# Patient Record
Sex: Female | Born: 1995 | Race: Black or African American | Hispanic: No | Marital: Single | Smoking: Former smoker
Health system: Southern US, Community
[De-identification: ages and names within clinical notes are randomized; demographics above are authoritative.]

## PROBLEM LIST (undated history)

## (undated) DIAGNOSIS — O165 Unspecified maternal hypertension, complicating the puerperium: Secondary | ICD-10-CM

## (undated) DIAGNOSIS — S060X9A Concussion with loss of consciousness of unspecified duration, initial encounter: Secondary | ICD-10-CM

## (undated) DIAGNOSIS — A6000 Herpesviral infection of urogenital system, unspecified: Secondary | ICD-10-CM

---
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
## 2014-02-24 ENCOUNTER — Emergency Department: Payer: Self-pay | Admitting: Internal Medicine

## 2014-06-28 DIAGNOSIS — S060XAA Concussion with loss of consciousness status unknown, initial encounter: Secondary | ICD-10-CM

## 2014-06-28 DIAGNOSIS — S060X9A Concussion with loss of consciousness of unspecified duration, initial encounter: Secondary | ICD-10-CM

## 2014-06-28 HISTORY — DX: Concussion with loss of consciousness of unspecified duration, initial encounter: S06.0X9A

## 2014-06-28 HISTORY — DX: Concussion with loss of consciousness status unknown, initial encounter: S06.0XAA

## 2015-06-15 ENCOUNTER — Encounter: Payer: Self-pay | Admitting: Emergency Medicine

## 2015-06-15 ENCOUNTER — Emergency Department
Admission: EM | Admit: 2015-06-15 | Discharge: 2015-06-15 | Disposition: A | Discharge status: Home / Self Care | Payer: Managed Care, Other (non HMO) | Attending: Emergency Medicine | Admitting: Emergency Medicine

## 2015-06-15 DIAGNOSIS — S0083XA Contusion of other part of head, initial encounter: Secondary | ICD-10-CM | POA: Insufficient documentation

## 2015-06-15 DIAGNOSIS — S0993XA Unspecified injury of face, initial encounter: Secondary | ICD-10-CM | POA: Diagnosis present

## 2015-06-15 DIAGNOSIS — Z87891 Personal history of nicotine dependence: Secondary | ICD-10-CM | POA: Insufficient documentation

## 2015-06-15 DIAGNOSIS — Y998 Other external cause status: Secondary | ICD-10-CM | POA: Diagnosis not present

## 2015-06-15 DIAGNOSIS — Y92410 Unspecified street and highway as the place of occurrence of the external cause: Secondary | ICD-10-CM | POA: Diagnosis not present

## 2015-06-15 DIAGNOSIS — Y9389 Activity, other specified: Secondary | ICD-10-CM | POA: Diagnosis not present

## 2015-06-15 DIAGNOSIS — S0990XA Unspecified injury of head, initial encounter: Secondary | ICD-10-CM | POA: Diagnosis not present

## 2015-06-15 DIAGNOSIS — Z3202 Encounter for pregnancy test, result negative: Secondary | ICD-10-CM | POA: Diagnosis not present

## 2015-06-15 LAB — POCT PREGNANCY, URINE: Preg Test, Ur: NEGATIVE

## 2015-06-15 MED ORDER — IBUPROFEN 800 MG PO TABS: 800.0000 mg | ORAL_TABLET | Freq: Three times a day (TID) | ORAL | Status: DC

## 2015-06-15 MED ORDER — ACETAMINOPHEN 500 MG PO TABS
1000.0000 mg | ORAL_TABLET | Freq: Once | ORAL | Status: AC
  Administered 2015-06-15: 1000 mg via ORAL
  Filled 2015-06-15: qty 2
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
## 2015-06-15 NOTE — Discharge Instructions (Signed)
Ice to face if needed for swelling or pain. Ibuprofen with food as needed for inflammation and pain. [facility] if any continued problems.

## 2015-09-22 ENCOUNTER — Emergency Department
Admission: EM | Admit: 2015-09-22 | Discharge: 2015-09-22 | Disposition: A | Discharge status: Home / Self Care | Payer: Managed Care, Other (non HMO) | Attending: Emergency Medicine | Admitting: Emergency Medicine

## 2015-09-22 ENCOUNTER — Encounter: Payer: Self-pay | Admitting: Emergency Medicine

## 2015-09-22 ENCOUNTER — Emergency Department
Admission: EM | Admit: 2015-09-22 | Discharge: 2015-09-22 | Disposition: A | Discharge status: Home / Self Care | Payer: Managed Care, Other (non HMO)

## 2015-09-22 DIAGNOSIS — Z791 Long term (current) use of non-steroidal anti-inflammatories (NSAID): Secondary | ICD-10-CM | POA: Insufficient documentation

## 2015-09-22 DIAGNOSIS — A749 Chlamydial infection, unspecified: Secondary | ICD-10-CM | POA: Insufficient documentation

## 2015-09-22 DIAGNOSIS — F17200 Nicotine dependence, unspecified, uncomplicated: Secondary | ICD-10-CM | POA: Diagnosis not present

## 2015-09-22 DIAGNOSIS — R102 Pelvic and perineal pain: Secondary | ICD-10-CM | POA: Diagnosis present

## 2015-09-22 LAB — COMPREHENSIVE METABOLIC PANEL
ALT: 13 U/L — ABNORMAL LOW (ref 14–54)
AST: 18 U/L (ref 15–41)
Albumin: 4.4 g/dL (ref 3.5–5.0)
Alkaline Phosphatase: 41 U/L (ref 38–126)
Anion gap: 5 (ref 5–15)
BUN: 7 mg/dL (ref 6–20)
CO2: 25 mmol/L (ref 22–32)
Calcium: 9.2 mg/dL (ref 8.9–10.3)
Chloride: 105 mmol/L (ref 101–111)
Creatinine, Ser: 0.68 mg/dL (ref 0.44–1.00)
GFR calc Af Amer: >60 mL/min (ref >60)
GFR calc non Af Amer: >60 mL/min (ref >60)
Glucose, Bld: 82 mg/dL (ref 65–99)
Potassium: 3.4 mmol/L — ABNORMAL LOW (ref 3.5–5.1)
Sodium: 135 mmol/L (ref 135–145)
Total Bilirubin: 0.5 mg/dL (ref 0.3–1.2)
Total Protein: 7.2 g/dL (ref 6.5–8.1)

## 2015-09-22 LAB — URINALYSIS COMPLETE WITH MICROSCOPIC (ARMC ONLY)
Bacteria, UA: NONE SEEN
Bilirubin Urine: NEGATIVE
Glucose, UA: NEGATIVE mg/dL
Ketones, ur: NEGATIVE mg/dL
Nitrite: NEGATIVE
Protein, ur: NEGATIVE mg/dL
Specific Gravity, Urine: 1.015 (ref 1.005–1.030)
pH: 9 — ABNORMAL HIGH (ref 5.0–8.0)

## 2015-09-22 LAB — CBC
HCT: 37.4 % (ref 35.0–47.0)
Hemoglobin: 13.1 g/dL (ref 12.0–16.0)
MCH: 33.8 pg (ref 26.0–34.0)
MCHC: 35.1 g/dL (ref 32.0–36.0)
MCV: 96.2 fL (ref 80.0–100.0)
Platelets: 215 10*3/uL (ref 150–440)
RBC: 3.88 MIL/uL (ref 3.80–5.20)
RDW: 12.5 % (ref 11.5–14.5)
WBC: 6.4 10*3/uL (ref 3.6–11.0)

## 2015-09-22 LAB — CHLAMYDIA/NGC RT PCR (ARMC ONLY)
Chlamydia Tr: DETECTED — AB
N gonorrhoeae: NOT DETECTED

## 2015-09-22 LAB — WET PREP, GENITAL
Clue Cells Wet Prep HPF POC: NONE SEEN
Sperm: NONE SEEN
Trich, Wet Prep: NONE SEEN
Yeast Wet Prep HPF POC: NONE SEEN

## 2015-09-22 LAB — PREGNANCY, URINE: Preg Test, Ur: NEGATIVE

## 2015-09-22 LAB — LIPASE, BLOOD: Lipase: 17 U/L (ref 11–51)

## 2015-09-22 MED ORDER — AZITHROMYCIN 250 MG PO TABS
1000.0000 mg | ORAL_TABLET | Freq: Once | ORAL | Status: AC
  Administered 2015-09-22: 1000 mg via ORAL
  Filled 2015-09-22: qty 4

## 2015-09-22 NOTE — Discharge Instructions (Signed)
Chlamydia, Female Chlamydia is an infection. It is spread through sexual contact. Chlamydia can be in different areas of the body. These areas include the cervix, urethra, throat, or rectum. You may not know you have chlamydia because many people never develop the symptoms. Chlamydia is not difficult to treat once you know you have it. However, if it is left untreated, chlamydia can lead to more serious health problems.  CAUSES  Chlamydia is caused by bacteria. It is a sexually transmitted disease. It is passed from an infected partner during intimate contact. This contact could be with the genitals, mouth, or rectal area. Chlamydia can also be passed from mothers to babies during birth. SIGNS AND SYMPTOMS  There may not be any symptoms. This is often the case early in the infection. If symptoms develop, they may include:  Mild pain and discomfort when urinating.  Redness, soreness, and swelling (inflammation) of the rectum.  Vaginal discharge.  Painful intercourse.  Abdominal pain.  Bleeding between menstrual periods. DIAGNOSIS  To diagnose this infection, your health care provider will do a pelvic exam. Cultures will be taken of the vagina, cervix, urine, and possibly the rectum to verify the diagnosis.  TREATMENT You will be given antibiotic medicines. If you are pregnant, certain types of antibiotics will need to be avoided. Any sexual partners should also be treated, even if they do not show symptoms. Your health care provider may test you for infection again 3 months after treatment. HOME CARE INSTRUCTIONS   Take your antibiotic medicine as directed by your health care provider. Finish the antibiotic even if you start to feel better.  Take medicines only as directed by your health care provider.  Inform any sexual partners about the infection. They should also be treated.  Do not have sexual contact until your health care provider tells you it is okay.  Get plenty of  rest.  Eat a well-balanced diet.  Drink enough fluids to keep your urine clear or pale yellow.  Keep all follow-up visits as directed by your health care provider. SEEK MEDICAL CARE IF:  You have painful urination.  You have abdominal pain.  You have vaginal discharge.  You have painful sexual intercourse.  You have bleeding between periods and after sex.  You have a fever. SEEK IMMEDIATE MEDICAL CARE IF:   You experience nausea or vomiting.  You experience excessive sweating (diaphoresis).  You have difficulty swallowing.   This information is not intended to replace advice given to you by your health care provider. Make sure you discuss any questions you have with your health care provider.   Document Released: 03/24/2005 Document Revised: 03/05/2015 Document Reviewed: 02/19/2013 Elsevier Interactive Patient Education 2016 [employer]  
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
## 2015-09-22 NOTE — ED Provider Notes (Signed)
[facility] Emergency Department Provider Note  ____________________________________________    I have reviewed the triage vital signs and the nursing notes.   HISTORY  Chief Complaint Abdominal Pain    HPI Carla Hill is a 20 y.o. female who presents with lower left pelvic cramping. She reports this is been occurring for 1 week. She denies vaginal discharge. She does report a protected intercourse. She denies fevers or chills. No dysuria. No history of abdominal surgeries. Complains of mild to moderate pelvic cramping primarily on the left. No history of ovarian cysts.     History reviewed. No pertinent past medical history.  There are no active problems to display for this patient.   History reviewed. No pertinent past surgical history.  Current Outpatient Rx  Name  Route  Sig  Dispense  Refill  . ibuprofen (ADVIL,MOTRIN) 800 MG tablet   Oral   Take 1 tablet (800 mg total) by mouth 3 (three) times daily.   30 tablet   0     Allergies Review of patient's allergies indicates no known allergies.  History reviewed. No pertinent family history.  Social History Social History  Substance Use Topics  . Smoking status: Current Every Day Smoker  . Smokeless tobacco: None  . Alcohol Use: Yes    Review of Systems  Constitutional: Negative for fever. Eyes: Negative for discharge ENT: Negative for sore throat Cardiovascular: Negative for palpitations Respiratory: Negative for cough Gastrointestinal: Negative for abdominal pain Genitourinary: Negative for dysuria. As above Musculoskeletal: Negative for back pain. Skin: Negative for rash. Neurological: Negative for focal weakness Psychiatric: no anxiety    ____________________________________________   PHYSICAL EXAM:  VITAL SIGNS: ED Triage Vitals  Enc Vitals Group     BP 09/22/15 1229 89/66 mmHg     Pulse Rate 09/22/15 1229 74     Resp --      Temp 09/22/15 1229 98.3 F (36.8  C)     Temp Source 09/22/15 1229 Oral     SpO2 09/22/15 1229 100 %     Weight 09/22/15 1229 130 lb (58.968 kg)     Height 09/22/15 1229  (1.676 m)     Head Cir --      Peak Flow --      Pain Score 09/22/15 1159 7     Pain Loc --      Pain Edu? --      Excl. in GC? --      Constitutional: Alert and oriented. Well appearing and in no distress.  Eyes: Conjunctivae are normal. No erythema or injection ENT   Head: Normocephalic and atraumatic.   Mouth/Throat: Mucous membranes are moist. Cardiovascular: Normal rate, regular rhythm.  Respiratory: Normal respiratory effort without tachypnea nor retractions.  Gastrointestinal: Soft and non-tender in all quadrants. No distention. There is no CVA tenderness. Genitourinary: Thick white discharge with mild cervicitis, no CMT Musculoskeletal: Nontender with normal range of motion in all extremities.  Neurologic:  Normal speech and language. No gross focal neurologic deficits are appreciated. Skin:  Skin is warm, dry and intact. No rash noted. Psychiatric: Mood and affect are normal. Patient exhibits appropriate insight and judgment.  ____________________________________________    LABS (pertinent positives/negatives)  Labs Reviewed  COMPREHENSIVE METABOLIC PANEL - Abnormal; Notable for the following:    Potassium 3.4 (*)    ALT 13 (*)    All other components within normal limits  URINALYSIS COMPLETEWITH MICROSCOPIC ([facility] ONLY) - Abnormal; Notable for the following:  Color, Urine YELLOW (*)    APPearance HAZY (*)    Hgb urine dipstick 2+ (*)    pH 9.0 (*)    Leukocytes, UA TRACE (*)    Squamous Epithelial / LPF 6-30 (*)    All other components within normal limits  WET PREP, GENITAL  CHLAMYDIA/NGC RT PCR ([facility] ONLY)  LIPASE, BLOOD  CBC  PREGNANCY, URINE    ____________________________________________   EKG  None  ____________________________________________     RADIOLOGY  None  ____________________________________________   PROCEDURES  Procedure(s) performed: none  Critical Care performed: none  ____________________________________________   INITIAL IMPRESSION / ASSESSMENT AND PLAN / ED COURSE  Pertinent labs & imaging results that were available during my care of the patient were reviewed by me and considered in my medical decision making (see chart for details).  Patient presents with vague lower pelvic cramping primarily in the left. She does have thick discharge with evidence of mild cervicitis. Wet prep and GC chlamydia sent. Otherwise labs are reassuring and exam is overall benign.  Chlamydia test positive, patient treated with azithromycin recommended no sexual activity for 7 days, treat all partners  ____________________________________________   FINAL CLINICAL IMPRESSION(S) / ED DIAGNOSES  Final diagnoses:  Chlamydia          Jene Everyobert Kaci Dillie, MD 09/22/15 1726

## 2015-10-21 LAB — HM PAP SMEAR: HM Pap smear: NEGATIVE

## 2016-06-01 ENCOUNTER — Encounter: Payer: Self-pay | Admitting: Emergency Medicine

## 2016-06-01 ENCOUNTER — Emergency Department
Admission: EM | Admit: 2016-06-01 | Discharge: 2016-06-01 | Disposition: A | Discharge status: Home / Self Care | Payer: Managed Care, Other (non HMO) | Attending: Emergency Medicine | Admitting: Emergency Medicine

## 2016-06-01 DIAGNOSIS — Z791 Long term (current) use of non-steroidal anti-inflammatories (NSAID): Secondary | ICD-10-CM | POA: Insufficient documentation

## 2016-06-01 DIAGNOSIS — J40 Bronchitis, not specified as acute or chronic: Secondary | ICD-10-CM | POA: Diagnosis not present

## 2016-06-01 DIAGNOSIS — F17200 Nicotine dependence, unspecified, uncomplicated: Secondary | ICD-10-CM | POA: Insufficient documentation

## 2016-06-01 DIAGNOSIS — R05 Cough: Secondary | ICD-10-CM | POA: Diagnosis present

## 2016-06-01 MED ORDER — PREDNISONE 50 MG PO TABS: 50.0000 mg | ORAL_TABLET | Freq: Every day | ORAL | 0 refills | Status: DC

## 2016-06-01 MED ORDER — ALBUTEROL SULFATE HFA 108 (90 BASE) MCG/ACT IN AERS: 2.0000 | INHALATION_SPRAY | RESPIRATORY_TRACT | 0 refills | Status: DC | PRN

## 2016-06-01 MED ORDER — PSEUDOEPH-BROMPHEN-DM 30-2-10 MG/5ML PO SYRP: 10.0000 mL | ORAL_SOLUTION | Freq: Four times a day (QID) | ORAL | 0 refills | Status: DC | PRN

## 2016-06-01 NOTE — ED Triage Notes (Signed)
Pt presents with cough since last Friday.

## 2016-06-01 NOTE — ED Provider Notes (Signed)
[facility] Emergency Department Provider Note  ____________________________________________  Time seen: Approximately 6:21 PM  I have reviewed the triage vital signs and the nursing notes.   HISTORY  Chief Complaint Cough    HPI Carla Hill is a 20 y.o. female who presents to emergency department complaining of 5 day history of coughing. Patient states that symptoms began insidiously. She denies any headache, visual changes,significant nasal congestion, sore throat, difficulty breathing, abdominal pain, nausea or vomiting. Patient states that the cough is dry. She denies any wheezing. She is not tried any medications prior to arrival. No other complaints at this time.   History reviewed. No pertinent past medical history.  There are no active problems to display for this patient.   History reviewed. No pertinent surgical history.  Prior to Admission medications   Medication Sig Start Date End Date Taking? Authorizing Provider  albuterol (PROVENTIL HFA;VENTOLIN HFA) 108 (90 Base) MCG/ACT inhaler Inhale 2 puffs into the lungs every 4 (four) hours as needed for wheezing or shortness of breath. 06/01/16   Delorise RoyalsJonathan D Mattelyn Imhoff, PA-C  brompheniramine-pseudoephedrine-DM 30-2-10 MG/5ML syrup Take 10 mLs by mouth 4 (four) times daily as needed. 06/01/16   Delorise RoyalsJonathan D Lamyah Creed, PA-C  ibuprofen (ADVIL,MOTRIN) 800 MG tablet Take 1 tablet (800 mg total) by mouth 3 (three) times daily. 06/15/15   Tommi Rumpshonda L Summers, PA-C  predniSONE (DELTASONE) 50 MG tablet Take 1 tablet (50 mg total) by mouth daily with breakfast. 06/01/16   Delorise RoyalsJonathan D Caramia Boutin, PA-C    Allergies Patient has no known allergies.  No family history on file.  Social History Social History  Substance Use Topics  . Smoking status: Current Every Day Smoker  . Smokeless tobacco: Never Used  . Alcohol use Yes     Review of Systems  Constitutional: No fever/chills Eyes: No visual changes. No  discharge ENT: No upper respiratory complaints. Cardiovascular: no chest pain. Respiratory: Positive for nonproductive cough. No SOB. Gastrointestinal: No abdominal pain.  No nausea, no vomiting. Musculoskeletal: Negative for musculoskeletal pain. Skin: Negative for rash, abrasions, lacerations, ecchymosis. Neurological: Negative for headaches, focal weakness or numbness. 10-point ROS otherwise negative.  ____________________________________________   PHYSICAL EXAM:  VITAL SIGNS: ED Triage Vitals  Enc Vitals Group     BP 06/01/16 1746 136/86     Pulse Rate 06/01/16 1746 100     Resp 06/01/16 1746 20     Temp 06/01/16 1746 98 F (36.7 C)     Temp Source 06/01/16 1746 Oral     SpO2 06/01/16 1746 98 %     Weight 06/01/16 1747 130 lb (59 kg)     Height --      Head Circumference --      Peak Flow --      Pain Score 06/01/16 1747 8     Pain Loc --      Pain Edu? --      Excl. in GC? --      Constitutional: Alert and oriented. Well appearing and in no acute distress. Eyes: Conjunctivae are normal. PERRL. EOMI. Head: Atraumatic. ENT:      Ears: EACs and TMs are unremarkable bilaterally      Nose: No congestion/rhinnorhea.      Mouth/Throat: Mucous membranes are moist.  Neck: No stridor.   Hematological/Lymphatic/Immunilogical: No cervical lymphadenopathy. Cardiovascular: Normal rate, regular rhythm. Normal S1 and S2.  Good peripheral circulation. Respiratory: Normal respiratory effort without tachypnea or retractions. Lungs with scattered coarse breath sounds and mild  expiratory wheezing. No rales or rhonchi.Peri [place]. Good air entry to the bases with no decreased or absent breath sounds. Musculoskeletal: Full range of motion to all extremities. No gross deformities appreciated. Neurologic:  Normal speech and language. No gross focal neurologic deficits are appreciated.  Skin:  Skin is warm, dry and intact. No rash noted. Psychiatric: Mood and affect are normal. Speech and behavior  are normal. Patient exhibits appropriate insight and judgement.   ____________________________________________   LABS (all labs ordered are listed, but only abnormal results are displayed)  Labs Reviewed - No data to display ____________________________________________  EKG   ____________________________________________  RADIOLOGY   No results found.  ____________________________________________    PROCEDURES  Procedure(s) performed:    Procedures    Medications - No data to display   ____________________________________________   INITIAL IMPRESSION / ASSESSMENT AND PLAN / ED COURSE  Pertinent labs & imaging results that were available during my care of the patient were reviewed by me and considered in my medical decision making (see chart for details).  Review of the [place] CSRS was performed in accordance of the [facility] prior to dispensing any controlled drugs.  Clinical Course     Patient's diagnosis is consistent with Bronchitis. Patient's exam is reassuring. She did have a few scattered coarse breath sounds/wheezing. At this time, no indication for imaging or labs.. Patient will be discharged home with prescriptions for prednisone, cough medication, albuterol inhaler. Patient is to follow up with primary care as needed or otherwise directed. Patient is given ED precautions to return to the ED for any worsening or new symptoms.     ____________________________________________  FINAL CLINICAL IMPRESSION(S) / ED DIAGNOSES  Final diagnoses:  Bronchitis      NEW MEDICATIONS STARTED DURING THIS VISIT:  New Prescriptions   ALBUTEROL (PROVENTIL HFA;VENTOLIN HFA) 108 (90 BASE) MCG/ACT INHALER    Inhale 2 puffs into the lungs every 4 (four) hours as needed for wheezing or shortness of breath.   BROMPHENIRAMINE-PSEUDOEPHEDRINE-DM 30-2-10 MG/5ML SYRUP    Take 10 mLs by mouth 4 (four) times daily as needed.   PREDNISONE (DELTASONE) 50 MG TABLET    Take 1 tablet  (50 mg total) by mouth daily with breakfast.        This chart was dictated using voice recognition software/Dragon. Despite best efforts to proofread, errors can occur which can change the meaning. Any change was purely unintentional.     Racheal PatchesJonathan D Cici Rodriges, PA-C 06/01/16 1835    Governor Rooksebecca Lord, MD 06/03/16 [id number]

## 2016-06-03 ENCOUNTER — Emergency Department: Payer: Managed Care, Other (non HMO)

## 2016-06-03 ENCOUNTER — Encounter: Payer: Self-pay | Admitting: Emergency Medicine

## 2016-06-03 ENCOUNTER — Emergency Department
Admission: EM | Admit: 2016-06-03 | Discharge: 2016-06-03 | Disposition: A | Discharge status: Home / Self Care | Payer: Managed Care, Other (non HMO) | Attending: Emergency Medicine | Admitting: Emergency Medicine

## 2016-06-03 DIAGNOSIS — F17200 Nicotine dependence, unspecified, uncomplicated: Secondary | ICD-10-CM | POA: Diagnosis not present

## 2016-06-03 DIAGNOSIS — R05 Cough: Secondary | ICD-10-CM | POA: Diagnosis present

## 2016-06-03 DIAGNOSIS — J40 Bronchitis, not specified as acute or chronic: Secondary | ICD-10-CM

## 2016-06-03 DIAGNOSIS — Z791 Long term (current) use of non-steroidal anti-inflammatories (NSAID): Secondary | ICD-10-CM | POA: Insufficient documentation

## 2016-06-03 DIAGNOSIS — Z79899 Other long term (current) drug therapy: Secondary | ICD-10-CM | POA: Diagnosis not present

## 2016-06-03 NOTE — ED Provider Notes (Signed)
[facility] Emergency Department Provider Note  ____________________________________________  Time seen: Approximately 10:14 PM  I have reviewed the triage vital signs and the nursing notes.   HISTORY  Chief Complaint Nasal Congestion and Cough    HPI Carla Hill is a 20 y.o. female who presents emergency department for continued complaint of coughing. Patient was seen by myself 2 days prior and diagnosed with bronchitis. Patient returns to the emergency department because "I'm not better yet." Patient is also requesting further work notes so she does not have to return to work. No headache, vision changes, neck pain, chest pain, multiple debridement, abdominal pain, nausea or vomiting. Patient has been taking prescribed medications.   History reviewed. No pertinent past medical history.  There are no active problems to display for this patient.   History reviewed. No pertinent surgical history.  Prior to Admission medications   Medication Sig Start Date End Date Taking? Authorizing Provider  albuterol (PROVENTIL HFA;VENTOLIN HFA) 108 (90 Base) MCG/ACT inhaler Inhale 2 puffs into the lungs every 4 (four) hours as needed for wheezing or shortness of breath. 06/01/16   Delorise RoyalsJonathan D Cuthriell, PA-C  brompheniramine-pseudoephedrine-DM 30-2-10 MG/5ML syrup Take 10 mLs by mouth 4 (four) times daily as needed. 06/01/16   Delorise RoyalsJonathan D Cuthriell, PA-C  ibuprofen (ADVIL,MOTRIN) 800 MG tablet Take 1 tablet (800 mg total) by mouth 3 (three) times daily. 06/15/15   Tommi Rumpshonda L Summers, PA-C  predniSONE (DELTASONE) 50 MG tablet Take 1 tablet (50 mg total) by mouth daily with breakfast. 06/01/16   Delorise RoyalsJonathan D Cuthriell, PA-C    Allergies Patient has no known allergies.  History reviewed. No pertinent family history.  Social History Social History  Substance Use Topics  . Smoking status: Current Every Day Smoker  . Smokeless tobacco: Never Used  . Alcohol use Yes      Review of Systems  Constitutional: No fever/chills Eyes: No visual changes. No discharge ENT: No upper respiratory complaints. Cardiovascular: no chest pain. Respiratory: Positive nonproductive cough. No SOB. Gastrointestinal: No abdominal pain.  No nausea, no vomiting.  Musculoskeletal: Negative for musculoskeletal pain. Skin: Negative for rash, abrasions, lacerations, ecchymosis. Neurological: Negative for headaches, focal weakness or numbness. 10-point ROS otherwise negative.  ____________________________________________   PHYSICAL EXAM:  VITAL SIGNS: ED Triage Vitals  Enc Vitals Group     BP 06/03/16 2040 114/73     Pulse Rate 06/03/16 2040 82     Resp 06/03/16 2040 18     Temp 06/03/16 2040 98.1 F (36.7 C)     Temp Source 06/03/16 2040 Oral     SpO2 06/03/16 2040 96 %     Weight 06/03/16 2041 130 lb (59 kg)     Height 06/03/16 2041 5\' 6"  (1.676 m)     Head Circumference --      Peak Flow --      Pain Score --      Pain Loc --      Pain Edu? --      Excl. in GC? --      Constitutional: Alert and oriented. Well appearing and in no acute distress. Eyes: Conjunctivae are normal. PERRL. EOMI. Head: Atraumatic. ENT:      Ears: EACs and TMs are unremarkable bilaterally      Nose: No congestion/rhinnorhea.      Mouth/Throat: Mucous membranes are moist.  Neck: No stridor. Neck is supple for range of motion Hematological/Lymphatic/Immunilogical: No cervical lymphadenopathy. Cardiovascular: Normal rate, regular rhythm. Normal S1 and S2.  Good peripheral circulation. Respiratory: Normal respiratory effort without tachypnea or retractions. Lungs CTAB. Good air entry to the bases with no decreased or absent breath sounds. Musculoskeletal: Full range of motion to all extremities. No gross deformities appreciated. Neurologic:  Normal speech and language. No gross focal neurologic deficits are appreciated.  Skin:  Skin is warm, dry and intact. No rash  noted. Psychiatric: Mood and affect are normal. Speech and behavior are normal. Patient exhibits appropriate insight and judgement.   ____________________________________________   LABS (all labs ordered are listed, but only abnormal results are displayed)  Labs Reviewed - No data to display ____________________________________________  EKG   ____________________________________________  RADIOLOGY Festus BarrenI, Jonathan D Cuthriell, personally viewed and evaluated these images (plain radiographs) as part of my medical decision making, as well as reviewing the written report by the radiologist.  Dg Chest 2 View  Result Date: 06/03/2016 CLINICAL DATA:  Shortness of breath and continued coughing, diagnosed with bronchitis 2 days prior. EXAM: CHEST  2 VIEW COMPARISON:  None. FINDINGS: The cardiomediastinal contours are normal. Mild hyperinflation. Pulmonary vasculature is normal. No consolidation, pleural effusion, or pneumothorax. No acute osseous abnormalities are seen. IMPRESSION: Mild hyperinflation, can be seen with bronchitis or asthma. No focal abnormality. Electronically Signed   By: Rubye OaksMelanie  Ehinger M.D.   On: 06/03/2016 21:50    ____________________________________________    PROCEDURES  Procedure(s) performed:    Procedures    Medications - No data to display   ____________________________________________   INITIAL IMPRESSION / ASSESSMENT AND PLAN / ED COURSE  Pertinent labs & imaging results that were available during my care of the patient were reviewed by me and considered in my medical decision making (see chart for details).  Review of the [place] CSRS was performed in accordance of the [facility] prior to dispensing any controlled drugs.  Clinical Course     Patient's diagnosis is consistent with Bronchitis. Patient returns to emergency department requesting further work note. At this time, exam is improved from previous visit. Chest x-ray reveals no areas consolidation  concerning for pneumonia. At this time, patient is informed that today's smack's time off granted by the emergency department if she requests for the time she may follow up with primary care..  Patient is given ED precautions to return to the ED for any worsening or new symptoms.     ____________________________________________  FINAL CLINICAL IMPRESSION(S) / ED DIAGNOSES  Final diagnoses:  Bronchitis      NEW MEDICATIONS STARTED DURING THIS VISIT:  Discharge Medication List as of 06/03/2016 10:15 PM          This chart was dictated using voice recognition software/Dragon. Despite best efforts to proofread, errors can occur which can change the meaning. Any change was purely unintentional.    Racheal PatchesJonathan D Cuthriell, PA-C 06/03/16 2321    Myrna Blazeravid Matthew Schaevitz, MD 06/04/16 [phone]

## 2016-06-03 NOTE — ED Triage Notes (Signed)
Pt ambulatory to triage with steady gait, no distress noted. Pt reports she was seen in ED on Tuesday, diagnosed with bronchitis, pt sts she is due to return to work tomorrow but feel she isn't able to do so, pt to ED today for a continued work note for tomorrow 06/04/16.

## 2016-06-28 NOTE — L&D Delivery Note (Signed)
Delivery Note Primary OB: [facility] Delivery Physician: Annamarie MajorPaul Shandelle Borrelli, MD Gestational Age: Full term Antepartum complications: none Intrapartum complications: Preeclampsia  A viable Female was delivered via vertex perentation.  Apgars:8 ,9  Weight:  pending .   Placenta status: spontaneous and Intact.  Cord: 3+ vessels;  with the following complications: none.  Anesthesia:  epidural Episiotomy:  none Lacerations:  periuretheral Suture Repair: 2.0 vicryl Est. Blood Loss (mL):  500 mL  Mom to postpartum.  Baby to Couplet care / Skin to Skin.  Annamarie MajorPaul Lavanda Nevels, MD, Merlinda FrederickFACOG [facility], [facility] 04/13/2017  11:55 PM [phone]

## 2016-07-09 ENCOUNTER — Emergency Department
Admission: EM | Admit: 2016-07-09 | Discharge: 2016-07-09 | Disposition: A | Discharge status: Home / Self Care | Payer: Managed Care, Other (non HMO) | Attending: Emergency Medicine | Admitting: Emergency Medicine

## 2016-07-09 ENCOUNTER — Encounter: Payer: Self-pay | Admitting: Emergency Medicine

## 2016-07-09 DIAGNOSIS — Z79899 Other long term (current) drug therapy: Secondary | ICD-10-CM | POA: Insufficient documentation

## 2016-07-09 DIAGNOSIS — F17200 Nicotine dependence, unspecified, uncomplicated: Secondary | ICD-10-CM | POA: Insufficient documentation

## 2016-07-09 DIAGNOSIS — N925 Other specified irregular menstruation: Secondary | ICD-10-CM | POA: Insufficient documentation

## 2016-07-09 DIAGNOSIS — Z791 Long term (current) use of non-steroidal anti-inflammatories (NSAID): Secondary | ICD-10-CM | POA: Insufficient documentation

## 2016-07-09 DIAGNOSIS — N926 Irregular menstruation, unspecified: Secondary | ICD-10-CM

## 2016-07-09 LAB — URINALYSIS, COMPLETE (UACMP) WITH MICROSCOPIC
Bacteria, UA: NONE SEEN
Bilirubin Urine: NEGATIVE
Glucose, UA: NEGATIVE mg/dL
Ketones, ur: NEGATIVE mg/dL
Leukocytes, UA: NEGATIVE
Nitrite: NEGATIVE
Protein, ur: NEGATIVE mg/dL
Specific Gravity, Urine: 1.01 (ref 1.005–1.030)
pH: 7 (ref 5.0–8.0)

## 2016-07-09 LAB — POCT PREGNANCY, URINE: Preg Test, Ur: NEGATIVE

## 2016-07-09 NOTE — ED Triage Notes (Signed)
Pt reports last menstrual period 06/02/17. Pt states she has regular periods and is late. Pt reports lower abdominal cramping like menstrual cramps. Pt denies fever, NVD. Pt reports white vaginal discharge, denies itching.

## 2016-07-09 NOTE — ED Provider Notes (Signed)
[facility] Emergency Department Provider Note  ____________________________________________  Time seen: Approximately 4:20 PM  I have reviewed the triage vital signs and the nursing notes.   HISTORY  Chief Complaint Possible pregnancy    HPI Carla Hill is a 21 y.o. female that presents to the emergency departmentwith a late menstrual period of 6 days. Patient states that she had some mild cramping a couple of days ago. Patient states that she had some white discharge, which is thin and not unusual for her. Patient has no concern for STD. No fever, chills, nausea, vomiting, back pain, dysuria, urgency, frequency.     History reviewed. No pertinent past medical history.  There are no active problems to display for this patient.   No past surgical history on file.  Prior to Admission medications   Medication Sig Start Date End Date Taking? Authorizing Provider  albuterol (PROVENTIL HFA;VENTOLIN HFA) 108 (90 Base) MCG/ACT inhaler Inhale 2 puffs into the lungs every 4 (four) hours as needed for wheezing or shortness of breath. 06/01/16   Delorise Royals Cuthriell, PA-C  brompheniramine-pseudoephedrine-DM 30-2-10 MG/5ML syrup Take 10 mLs by mouth 4 (four) times daily as needed. 06/01/16   Delorise Royals Cuthriell, PA-C  ibuprofen (ADVIL,MOTRIN) 800 MG tablet Take 1 tablet (800 mg total) by mouth 3 (three) times daily. 06/15/15   Tommi Rumps, PA-C  predniSONE (DELTASONE) 50 MG tablet Take 1 tablet (50 mg total) by mouth daily with breakfast. 06/01/16   Delorise Royals Cuthriell, PA-C    Allergies Patient has no known allergies.  No family history on file.  Social History Social History  Substance Use Topics  . Smoking status: Current Every Day Smoker  . Smokeless tobacco: Never Used  . Alcohol use Yes     Review of Systems  Constitutional: No fever/chills ENT: No upper respiratory complaints. Cardiovascular: No chest pain. Respiratory: No  SOB. Gastrointestinal: No nausea, no vomiting.  Genitourinary: Negative for dysuria. Musculoskeletal: Negative for musculoskeletal pain. Skin: Negative for rash, abrasions, lacerations, ecchymosis. Neurological: Negative for headaches, numbness or tingling   ____________________________________________   PHYSICAL EXAM:  VITAL SIGNS: ED Triage Vitals  Enc Vitals Group     BP 07/09/16 1521 126/75     Pulse Rate 07/09/16 1521 84     Resp 07/09/16 1521 18     Temp 07/09/16 1521 98.4 F (36.9 C)     Temp Source 07/09/16 1521 Oral     SpO2 07/09/16 1521 99 %     Weight 07/09/16 1521 130 lb (59 kg)     Height 07/09/16 1521 5\' 6"  (1.676 m)     Head Circumference --      Peak Flow --      Pain Score 07/09/16 1526 6     Pain Loc --      Pain Edu? --      Excl. in GC? --      Constitutional: Alert and oriented. Well appearing and in no acute distress. Eyes: Conjunctivae are normal. PERRL. EOMI. Head: Atraumatic. ENT:      Ears:      Nose: No congestion/rhinnorhea.      Mouth/Throat: Mucous membranes are moist.  Neck: No stridor.  Cardiovascular: Normal rate, regular rhythm. Normal S1 and S2.  Good peripheral circulation. Respiratory: Normal respiratory effort without tachypnea or retractions. Lungs CTAB. Good air entry to the bases with no decreased or absent breath sounds. Gastrointestinal: Bowel sounds 4 quadrants. Soft and nontender to palpation. No guarding or  rigidity. No palpable masses. No distention. No CVA tenderness. Musculoskeletal: Full range of motion to all extremities. No gross deformities appreciated. Neurologic:  Normal speech and language. No gross focal neurologic deficits are appreciated.  Skin:  Skin is warm, dry and intact. No rash noted. Psychiatric: Mood and affect are normal. Speech and behavior are normal. Patient exhibits appropriate insight and judgement.   ____________________________________________   LABS (all labs ordered are listed, but  only abnormal results are displayed)  Labs Reviewed  URINALYSIS, COMPLETE (UACMP) WITH MICROSCOPIC  POCT PREGNANCY, URINE   ____________________________________________  EKG   ____________________________________________  RADIOLOGY   No results found.  ____________________________________________    PROCEDURES  Procedure(s) performed:    Procedures    Medications - No data to display   ____________________________________________   INITIAL IMPRESSION / ASSESSMENT AND PLAN / ED COURSE  Pertinent labs & imaging results that were available during my care of the patient were reviewed by me and considered in my medical decision making (see chart for details).  Review of the [place] CSRS was performed in accordance of the [facility] prior to dispensing any controlled drugs.  Clinical Course     Patient's diagnosis is consistent with late menstrual period. Exam and vital signs are reassuring. Urine pregnancy test negative. No signs of infection on urinalysis. Patient is to follow up with ob/gyn as directed. Patient is given ED precautions to return to the ED for any worsening or new symptoms.   ____________________________________________  FINAL CLINICAL IMPRESSION(S) / ED DIAGNOSES  Final diagnoses:  Menstrual period late      NEW MEDICATIONS STARTED DURING THIS VISIT:  New Prescriptions   No medications on file        This chart was dictated using voice recognition software/Dragon. Despite best efforts to proofread, errors can occur which can change the meaning. Any change was purely unintentional.    Enid DerryAshley Vishnu Moeller, PA-C 07/09/16 1644    Minna AntisKevin Paduchowski, MD 07/09/16 2235

## 2016-11-19 ENCOUNTER — Other Ambulatory Visit: Payer: Self-pay | Admitting: Obstetrics & Gynecology

## 2016-11-19 DIAGNOSIS — Z363 Encounter for antenatal screening for malformations: Secondary | ICD-10-CM

## 2016-11-29 ENCOUNTER — Ambulatory Visit (INDEPENDENT_AMBULATORY_CARE_PROVIDER_SITE_OTHER): Payer: Medicaid Other

## 2016-11-29 DIAGNOSIS — Z363 Encounter for antenatal screening for malformations: Secondary | ICD-10-CM

## 2017-01-25 LAB — HM HIV SCREENING LAB: HM HIV Screening: NEGATIVE

## 2017-04-12 ENCOUNTER — Inpatient Hospital Stay
Admission: EM | Admit: 2017-04-12 | Discharge: 2017-04-15 | DRG: 806 | Disposition: A | Discharge status: Home / Self Care | Payer: Medicaid Other | Attending: Obstetrics and Gynecology | Admitting: Obstetrics and Gynecology

## 2017-04-12 DIAGNOSIS — O1414 Severe pre-eclampsia complicating childbirth: Secondary | ICD-10-CM | POA: Diagnosis present

## 2017-04-12 DIAGNOSIS — D62 Acute posthemorrhagic anemia: Secondary | ICD-10-CM | POA: Diagnosis not present

## 2017-04-12 DIAGNOSIS — Z87891 Personal history of nicotine dependence: Secondary | ICD-10-CM | POA: Diagnosis not present

## 2017-04-12 DIAGNOSIS — O99824 Streptococcus B carrier state complicating childbirth: Secondary | ICD-10-CM | POA: Diagnosis present

## 2017-04-12 DIAGNOSIS — F1290 Cannabis use, unspecified, uncomplicated: Secondary | ICD-10-CM | POA: Diagnosis present

## 2017-04-12 DIAGNOSIS — O1214 Gestational proteinuria, complicating childbirth: Secondary | ICD-10-CM | POA: Diagnosis present

## 2017-04-12 DIAGNOSIS — Z3A39 39 weeks gestation of pregnancy: Secondary | ICD-10-CM | POA: Diagnosis not present

## 2017-04-12 DIAGNOSIS — O99324 Drug use complicating childbirth: Secondary | ICD-10-CM | POA: Diagnosis present

## 2017-04-12 DIAGNOSIS — O9081 Anemia of the puerperium: Secondary | ICD-10-CM | POA: Diagnosis not present

## 2017-04-12 LAB — PROTEIN / CREATININE RATIO, URINE
Creatinine, Urine: 35 mg/dL
Protein Creatinine Ratio: 4.54 mg/mg{Cre} — ABNORMAL HIGH (ref 0.00–0.15)
Total Protein, Urine: 159 mg/dL

## 2017-04-12 LAB — CBC
HCT: 37.2 % (ref 35.0–47.0)
Hemoglobin: 12.8 g/dL (ref 12.0–16.0)
MCH: 33 pg (ref 26.0–34.0)
MCHC: 34.4 g/dL (ref 32.0–36.0)
MCV: 95.8 fL (ref 80.0–100.0)
Platelets: 208 10*3/uL (ref 150–440)
RBC: 3.88 MIL/uL (ref 3.80–5.20)
RDW: 13.6 % (ref 11.5–14.5)
WBC: 8.1 10*3/uL (ref 3.6–11.0)

## 2017-04-12 LAB — URINE DRUG SCREEN, QUALITATIVE (ARMC ONLY)
Amphetamines, Ur Screen: NOT DETECTED
Barbiturates, Ur Screen: NOT DETECTED
Benzodiazepine, Ur Scrn: NOT DETECTED
Cannabinoid 50 Ng, Ur ~~LOC~~: NOT DETECTED
Cocaine Metabolite,Ur ~~LOC~~: NOT DETECTED
MDMA (Ecstasy)Ur Screen: NOT DETECTED
Methadone Scn, Ur: NOT DETECTED
Opiate, Ur Screen: NOT DETECTED
Phencyclidine (PCP) Ur S: NOT DETECTED
Tricyclic, Ur Screen: NOT DETECTED

## 2017-04-12 LAB — COMPREHENSIVE METABOLIC PANEL
ALT: 19 U/L (ref 14–54)
AST: 30 U/L (ref 15–41)
Albumin: 2.4 g/dL — ABNORMAL LOW (ref 3.5–5.0)
Alkaline Phosphatase: 166 U/L — ABNORMAL HIGH (ref 38–126)
Anion gap: 7 (ref 5–15)
BUN: <5 mg/dL — ABNORMAL LOW (ref 6–20)
CO2: 22 mmol/L (ref 22–32)
Calcium: 8.9 mg/dL (ref 8.9–10.3)
Chloride: 109 mmol/L (ref 101–111)
Creatinine, Ser: 0.68 mg/dL (ref 0.44–1.00)
GFR calc Af Amer: >60 mL/min (ref >60)
GFR calc non Af Amer: >60 mL/min (ref >60)
Glucose, Bld: 88 mg/dL (ref 65–99)
Potassium: 3.7 mmol/L (ref 3.5–5.1)
Sodium: 138 mmol/L (ref 135–145)
Total Bilirubin: 0.6 mg/dL (ref 0.3–1.2)
Total Protein: 5.5 g/dL — ABNORMAL LOW (ref 6.5–8.1)

## 2017-04-12 LAB — TYPE AND SCREEN
ABO/RH(D): O POS
Antibody Screen: NEGATIVE

## 2017-04-12 LAB — CHLAMYDIA/NGC RT PCR (ARMC ONLY)
Chlamydia Tr: NOT DETECTED
N gonorrhoeae: NOT DETECTED

## 2017-04-12 MED ORDER — LIDOCAINE HCL (PF) 1 % IJ SOLN
INTRAMUSCULAR | Status: AC
  Filled 2017-04-12: qty 30

## 2017-04-12 MED ORDER — PENICILLIN G POT IN DEXTROSE 60000 UNIT/ML IV SOLN
3.0000 10*6.[IU] | INTRAVENOUS | Status: DC
  Administered 2017-04-13 (×4): 3 10*6.[IU] via INTRAVENOUS
  Filled 2017-04-12 (×10): qty 50

## 2017-04-12 MED ORDER — ACETAMINOPHEN 325 MG PO TABS: 650.0000 mg | ORAL_TABLET | ORAL | Status: DC | PRN

## 2017-04-12 MED ORDER — LACTATED RINGERS IV SOLN
500.0000 mL | INTRAVENOUS | Status: DC | PRN
  Administered 2017-04-13: 500 mL via INTRAVENOUS

## 2017-04-12 MED ORDER — BUTORPHANOL TARTRATE 2 MG/ML IJ SOLN
1.0000 mg | INTRAMUSCULAR | Status: DC | PRN
  Administered 2017-04-13: 1 mg via INTRAVENOUS
  Filled 2017-04-12: qty 1

## 2017-04-12 MED ORDER — OXYTOCIN 10 UNIT/ML IJ SOLN
INTRAMUSCULAR | Status: AC
  Filled 2017-04-12: qty 2

## 2017-04-12 MED ORDER — ONDANSETRON HCL 4 MG/2ML IJ SOLN
4.0000 mg | Freq: Four times a day (QID) | INTRAMUSCULAR | Status: DC | PRN
  Administered 2017-04-13: 4 mg via INTRAVENOUS
  Filled 2017-04-12: qty 2

## 2017-04-12 MED ORDER — SODIUM CHLORIDE FLUSH 0.9 % IV SOLN
INTRAVENOUS | Status: AC
  Administered 2017-04-12: 10 mL
  Filled 2017-04-12: qty 10

## 2017-04-12 MED ORDER — AMMONIA AROMATIC IN INHA
RESPIRATORY_TRACT | Status: AC
  Filled 2017-04-12: qty 10

## 2017-04-12 MED ORDER — HYDRALAZINE HCL 20 MG/ML IJ SOLN
10.0000 mg | Freq: Once | INTRAMUSCULAR | Status: DC | PRN
  Filled 2017-04-12: qty 1

## 2017-04-12 MED ORDER — OXYTOCIN 40 UNITS IN LACTATED RINGERS INFUSION - SIMPLE MED
INTRAVENOUS | Status: AC
  Administered 2017-04-13: 2 m[IU]/min via INTRAVENOUS
  Filled 2017-04-12: qty 1000

## 2017-04-12 MED ORDER — MISOPROSTOL 200 MCG PO TABS
ORAL_TABLET | ORAL | Status: AC
  Administered 2017-04-12: 25 ug via VAGINAL
  Filled 2017-04-12: qty 4

## 2017-04-12 MED ORDER — LACTATED RINGERS IV SOLN
INTRAVENOUS | Status: DC
  Administered 2017-04-12 – 2017-04-15 (×3): via INTRAVENOUS

## 2017-04-12 MED ORDER — LIDOCAINE HCL (PF) 1 % IJ SOLN: 30.0000 mL | INTRAMUSCULAR | Status: DC | PRN

## 2017-04-12 MED ORDER — MAGNESIUM SULFATE 50 % IJ SOLN
2.0000 g/h | INTRAVENOUS | Status: DC
  Administered 2017-04-14: 2 g/h via INTRAVENOUS
  Filled 2017-04-12 (×2): qty 80

## 2017-04-12 MED ORDER — MAGNESIUM SULFATE BOLUS VIA INFUSION
6.0000 g | Freq: Once | INTRAVENOUS | Status: AC
  Administered 2017-04-13: 6 g via INTRAVENOUS
  Filled 2017-04-12: qty 500

## 2017-04-12 MED ORDER — OXYTOCIN BOLUS FROM INFUSION
500.0000 mL | Freq: Once | INTRAVENOUS | Status: DC
  Administered 2017-04-13: 2 m[IU]/min via INTRAVENOUS
  Administered 2017-04-13: 500 mL via INTRAVENOUS

## 2017-04-12 MED ORDER — OXYTOCIN 40 UNITS IN LACTATED RINGERS INFUSION - SIMPLE MED: 2.5000 [IU]/h | INTRAVENOUS | Status: DC

## 2017-04-12 MED ORDER — PENICILLIN G POTASSIUM 5000000 UNITS IJ SOLR
5.0000 10*6.[IU] | Freq: Once | INTRAVENOUS | Status: AC
  Administered 2017-04-12: 5 10*6.[IU] via INTRAVENOUS
  Filled 2017-04-12: qty 5

## 2017-04-12 MED ORDER — LABETALOL HCL 5 MG/ML IV SOLN
20.0000 mg | INTRAVENOUS | Status: AC | PRN
  Administered 2017-04-12: 20 mg via INTRAVENOUS
  Administered 2017-04-13: 40 mg via INTRAVENOUS
  Administered 2017-04-13: 20 mg via INTRAVENOUS
  Filled 2017-04-12 (×2): qty 4
  Filled 2017-04-12: qty 8

## 2017-04-12 MED ORDER — MISOPROSTOL 25 MCG QUARTER TABLET
25.0000 ug | ORAL_TABLET | ORAL | Status: DC | PRN
  Administered 2017-04-12 – 2017-04-13 (×2): 25 ug via VAGINAL
  Filled 2017-04-12 (×2): qty 1

## 2017-04-12 MED ORDER — TERBUTALINE SULFATE 1 MG/ML IJ SOLN: 0.2500 mg | Freq: Once | INTRAMUSCULAR | Status: DC | PRN

## 2017-04-12 NOTE — H&P (Signed)
OB History & Physical   History of Present Illness:  Chief Complaint: sent from health department for elevated 24 hour protein in urine  HPI:  Carla Hill is a 21 y.o. G1P0000 female at [redacted]w[redacted]d dated by LMP.  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 Blood pressure has been consistently elevated since arrival on L&D 140s 150s systolic over 100s diastolic. She has had a couple of severe range pressures but nothing consistent.    She reports contractions.   She denies leakage of fluid.   She denies vaginal bleeding.   She reports fetal movement. She denies headache or visual changes or epigastric pain.    Maternal Medical History:  History reviewed. No pertinent past medical history. Patient denies history of asthma.   History reviewed. No pertinent surgical history.  No Known Allergies  Prior to Admission medications   Medication Sig Start Date End Date Taking? Authorizing Provider  Prenatal Vit-Fe Fumarate-FA (MULTIVITAMIN-PRENATAL) 27-0.8 MG TABS tablet Take 1 tablet by mouth daily at 12 noon.   Yes [provider]  albuterol (PROVENTIL HFA;VENTOLIN HFA) 108 (90 Base) MCG/ACT inhaler Inhale 2 puffs into the lungs every 4 (four) hours as needed for wheezing or shortness of breath. Patient not taking: Reported on 04/12/2017 06/01/16   Cuthriell, Delorise Royals, PA-C  brompheniramine-pseudoephedrine-DM 30-2-10 MG/5ML syrup Take 10 mLs by mouth 4 (four) times daily as needed. Patient not taking: Reported on 04/12/2017 06/01/16   Cuthriell, Delorise Royals, PA-C  ibuprofen (ADVIL,MOTRIN) 800 MG tablet Take 1 tablet (800 mg total) by mouth 3 (three) times daily. Patient not taking: Reported on 04/12/2017 06/15/15   Tommi Rumps, PA-C  predniSONE (DELTASONE) 50 MG tablet Take 1 tablet (50 mg total) by mouth daily with breakfast. Patient not taking: Reported on 04/12/2017 06/01/16    Cuthriell, Delorise Royals, PA-C    OB History  Gravida Para Term Preterm AB Living  1 0 0 0 0 0  SAB TAB Ectopic Multiple Live Births  0 0 0 0      # Outcome Date GA Lbr Len/2nd Weight Sex Delivery Anes PTL Lv  1 Current               Prenatal care site: [facility]  Social History: She  reports that she quit smoking about 7 months ago. Her smoking use included Cigarettes. She has never used smokeless tobacco. She reports that she does not drink alcohol or use drugs.  Family History: family history is not on file.  She does not have a family history of gynecologic cancers  Review of Systems: Negative x 10 systems reviewed except as noted in the HPI.    Physical Exam:  Vital Signs: BP (!) 151/107   Pulse 83   Temp 98.5 F (36.9 C) (Oral)   Resp 18   Ht  (1.676 m)   Wt 180 lb (81.6 kg)   LMP 07/12/2016 (Approximate)   SpO2 100%   BMI 29.05 kg/m  Constitutional: Well nourished, well developed female in no acute distress.  HEENT: normal Skin: Warm and dry.  Cardiovascular: Regular rate and rhythm.   Extremity: edema +1 Respiratory: Clear to auscultation bilateral. Normal respiratory effort Abdomen: soft, nontender, nondistended, no abnormal masses, no epigastric pain and FHT present Back: no CVAT Neuro: DTRs 2+, Cranial nerves grossly intact Psych: Alert and Oriented x3. No memory deficits. Normal mood and affect.  MS: normal gait,  normal bilateral lower extremity ROM/strength/stability.  Pelvic exam:  is not limited by body habitus EGBUS: within normal limits Vagina: within normal limits and with normal mucosa  Cervix: 1.5/60/-3, cervical sweep  Results for Carla Hill, Carla Hill (MRN [id number]) as of 04/12/2017 20:49  Ref. Range 04/12/2017 18:12 04/12/2017 18:30  Sodium Latest Ref Range: 135 - 145 mmol/L  138  Potassium Latest Ref Range: 3.5 - 5.1 mmol/L  3.7  Chloride Latest Ref Range: 101 - 111 mmol/L  109  CO2 Latest Ref Range: 22 - 32 mmol/L  22  Glucose Latest Ref  Range: 65 - 99 mg/dL  88  BUN Latest Ref Range: 6 - 20 mg/dL  <5 (L)  Creatinine Latest Ref Range: 0.44 - 1.00 mg/dL  7.82  Calcium Latest Ref Range: 8.9 - 10.3 mg/dL  8.9  Anion gap Latest Ref Range: 5 - 15   7  Alkaline Phosphatase Latest Ref Range: 38 - 126 U/L  166 (H)  Albumin Latest Ref Range: 3.5 - 5.0 g/dL  2.4 (L)  AST Latest Ref Range: 15 - 41 U/L  30  ALT Latest Ref Range: 14 - 54 U/L  19  Total Protein Latest Ref Range: 6.5 - 8.1 g/dL  5.5 (L)  Total Bilirubin Latest Ref Range: 0.3 - 1.2 mg/dL  0.6  GFR, Est Non African American Latest Ref Range: >60 mL/min  >60  GFR, Est African American Latest Ref Range: >60 mL/min  >60  WBC Latest Ref Range: 3.6 - 11.0 K/uL  8.1  RBC Latest Ref Range: 3.80 - 5.20 MIL/uL  3.88  Hemoglobin Latest Ref Range: 12.0 - 16.0 g/dL  95.6  HCT Latest Ref Range: 35.0 - 47.0 %  37.2  MCV Latest Ref Range: 80.0 - 100.0 fL  95.8  MCH Latest Ref Range: 26.0 - 34.0 pg  33.0  MCHC Latest Ref Range: 32.0 - 36.0 g/dL  21.3  RDW Latest Ref Range: 11.5 - 14.5 %  13.6  Platelets Latest Ref Range: 150 - 440 K/uL  208  Total Protein, Urine Latest Units: mg/dL 086   Protein Creatinine Ratio Latest Ref Range: 0.00 - 0.15 mg/mgCre 4.54 (H)   Creatinine, Urine Latest Units: mg/dL 35      Pertinent Results:  Prenatal Labs: Blood type/Rh O positive  Antibody screen negative  Rubella Immune  Varicella Immune    RPR Non-reactive  HBsAg negative  HIV negative  GC negative  Chlamydia negative  Genetic screening Not done  1 hour GTT wnl per patient report  3 hour GTT NA  GBS positive on 9/25   Baseline FHR: 140 beats/min   Variability: moderate   Accelerations: present   Decelerations: absent Contractions: present frequency: every 2-3 Overall assessment: Category I    Assessment:  Carla Hill is a 21 y.o. G19P0000 female at [redacted]w[redacted]d with Induction of labor for preeclampsia.   Plan:  1. Admit to Labor & Delivery  2. CBC, T&S, Clrs, IVF 3. GBS  positive.  Penicillin prophylaxis 4. Fetal well-being: Category I 5. Labor management: begin induction with cytotec for cervical ripening 6. Labetalol protocol for severe blood pressures  7. Magnesium Sulfate for severe blood pressures  Plan of care discussed with patient and all questions answered  Tresea Mall, CNM 04/12/2017 8:38 PM

## 2017-04-12 NOTE — OB Triage Note (Signed)
Pt presents from the health department for proteinurea. Pt denies any HA, blurred vision or epigastric pain. Pt has +2 pitting edema on bilateral lower extremities, no clonus and 2+ reflexes. Denies any pain, NVD, bleeding or LOF. Reports positive fetal movement. Bp cycling q 15 minutes, will continue to monitor

## 2017-04-13 ENCOUNTER — Inpatient Hospital Stay: Payer: Medicaid Other | Admitting: Anesthesiology

## 2017-04-13 ENCOUNTER — Encounter: Payer: Self-pay | Admitting: Anesthesiology

## 2017-04-13 DIAGNOSIS — O1414 Severe pre-eclampsia complicating childbirth: Secondary | ICD-10-CM

## 2017-04-13 DIAGNOSIS — Z3A39 39 weeks gestation of pregnancy: Secondary | ICD-10-CM

## 2017-04-13 LAB — CBC
HCT: 39.8 % (ref 35.0–47.0)
Hemoglobin: 13.5 g/dL (ref 12.0–16.0)
MCH: 32.7 pg (ref 26.0–34.0)
MCHC: 33.9 g/dL (ref 32.0–36.0)
MCV: 96.3 fL (ref 80.0–100.0)
Platelets: 214 10*3/uL (ref 150–440)
RBC: 4.14 MIL/uL (ref 3.80–5.20)
RDW: 14 % (ref 11.5–14.5)
WBC: 10.1 10*3/uL (ref 3.6–11.0)

## 2017-04-13 MED ORDER — ONDANSETRON HCL 4 MG PO TABS: 4.0000 mg | ORAL_TABLET | ORAL | Status: DC | PRN

## 2017-04-13 MED ORDER — ZOLPIDEM TARTRATE 5 MG PO TABS: 5.0000 mg | ORAL_TABLET | Freq: Every evening | ORAL | Status: DC | PRN

## 2017-04-13 MED ORDER — SODIUM CHLORIDE 0.9% FLUSH: 3.0000 mL | INTRAVENOUS | Status: DC | PRN

## 2017-04-13 MED ORDER — LABETALOL HCL 5 MG/ML IV SOLN
20.0000 mg | INTRAVENOUS | Status: AC | PRN
  Administered 2017-04-13: 80 mg via INTRAVENOUS
  Administered 2017-04-13: 40 mg via INTRAVENOUS
  Administered 2017-04-14: 20 mg via INTRAVENOUS
  Filled 2017-04-13 (×2): qty 16
  Filled 2017-04-13: qty 4

## 2017-04-13 MED ORDER — WITCH HAZEL-GLYCERIN EX PADS: 1.0000 "application " | MEDICATED_PAD | CUTANEOUS | Status: DC | PRN

## 2017-04-13 MED ORDER — LIDOCAINE HCL (PF) 1 % IJ SOLN
INTRAMUSCULAR | Status: DC | PRN
  Administered 2017-04-13 (×3): 1 mL via INTRADERMAL

## 2017-04-13 MED ORDER — BENZOCAINE-MENTHOL 20-0.5 % EX AERO: 1.0000 "application " | INHALATION_SPRAY | CUTANEOUS | Status: DC | PRN

## 2017-04-13 MED ORDER — PHENYLEPHRINE 40 MCG/ML (10ML) SYRINGE FOR IV PUSH (FOR BLOOD PRESSURE SUPPORT): 80.0000 ug | PREFILLED_SYRINGE | INTRAVENOUS | Status: DC | PRN

## 2017-04-13 MED ORDER — LACTATED RINGERS IV SOLN: 500.0000 mL | Freq: Once | INTRAVENOUS | Status: DC

## 2017-04-13 MED ORDER — DIPHENHYDRAMINE HCL 50 MG/ML IJ SOLN: 12.5000 mg | INTRAMUSCULAR | Status: DC | PRN

## 2017-04-13 MED ORDER — IBUPROFEN 600 MG PO TABS
600.0000 mg | ORAL_TABLET | Freq: Four times a day (QID) | ORAL | Status: DC
  Administered 2017-04-14 – 2017-04-15 (×6): 600 mg via ORAL
  Filled 2017-04-13 (×6): qty 1

## 2017-04-13 MED ORDER — SENNOSIDES-DOCUSATE SODIUM 8.6-50 MG PO TABS
2.0000 | ORAL_TABLET | ORAL | Status: DC
  Administered 2017-04-15: 2 via ORAL
  Filled 2017-04-13: qty 2

## 2017-04-13 MED ORDER — TETANUS-DIPHTH-ACELL PERTUSSIS 5-2.5-18.5 LF-MCG/0.5 IM SUSP
0.5000 mL | Freq: Once | INTRAMUSCULAR | Status: DC
  Filled 2017-04-13: qty 0.5

## 2017-04-13 MED ORDER — SODIUM CHLORIDE 0.9 % IV SOLN: 250.0000 mL | INTRAVENOUS | Status: DC | PRN

## 2017-04-13 MED ORDER — ONDANSETRON HCL 4 MG/2ML IJ SOLN: 4.0000 mg | INTRAMUSCULAR | Status: DC | PRN

## 2017-04-13 MED ORDER — OXYTOCIN 40 UNITS IN LACTATED RINGERS INFUSION - SIMPLE MED
1.0000 m[IU]/min | INTRAVENOUS | Status: DC
  Filled 2017-04-13: qty 1000

## 2017-04-13 MED ORDER — EPHEDRINE 5 MG/ML INJ: 10.0000 mg | INTRAVENOUS | Status: DC | PRN

## 2017-04-13 MED ORDER — TERBUTALINE SULFATE 1 MG/ML IJ SOLN: 0.2500 mg | Freq: Once | INTRAMUSCULAR | Status: DC | PRN

## 2017-04-13 MED ORDER — LABETALOL HCL 200 MG PO TABS
200.0000 mg | ORAL_TABLET | Freq: Two times a day (BID) | ORAL | Status: DC
Start: 2017-04-14 — End: 2017-04-15
  Administered 2017-04-14 – 2017-04-15 (×3): 200 mg via ORAL
  Filled 2017-04-13 (×3): qty 1

## 2017-04-13 MED ORDER — FENTANYL 2.5 MCG/ML W/ROPIVACAINE 0.15% IN NS 100 ML EPIDURAL (ARMC)
12.0000 mL/h | EPIDURAL | Status: DC
  Administered 2017-04-13 (×2): 12 mL/h via EPIDURAL
  Filled 2017-04-13: qty 100

## 2017-04-13 MED ORDER — LABETALOL HCL 5 MG/ML IV SOLN
INTRAVENOUS | Status: AC
  Administered 2017-04-13: 80 mg via INTRAVENOUS
  Filled 2017-04-13: qty 4

## 2017-04-13 MED ORDER — DIBUCAINE 1 % RE OINT: 1.0000 "application " | TOPICAL_OINTMENT | RECTAL | Status: DC | PRN

## 2017-04-13 MED ORDER — SIMETHICONE 80 MG PO CHEW: 80.0000 mg | CHEWABLE_TABLET | ORAL | Status: DC | PRN

## 2017-04-13 MED ORDER — DIPHENHYDRAMINE HCL 25 MG PO CAPS: 25.0000 mg | ORAL_CAPSULE | Freq: Four times a day (QID) | ORAL | Status: DC | PRN

## 2017-04-13 MED ORDER — FENTANYL 2.5 MCG/ML W/ROPIVACAINE 0.15% IN NS 100 ML EPIDURAL (ARMC)
EPIDURAL | Status: AC
  Filled 2017-04-13: qty 100

## 2017-04-13 MED ORDER — ACETAMINOPHEN 325 MG PO TABS
650.0000 mg | ORAL_TABLET | ORAL | Status: DC | PRN
  Administered 2017-04-15: 650 mg via ORAL
  Filled 2017-04-13: qty 2

## 2017-04-13 MED ORDER — SODIUM CHLORIDE 0.9% FLUSH: 3.0000 mL | Freq: Two times a day (BID) | INTRAVENOUS | Status: DC

## 2017-04-13 MED ORDER — COCONUT OIL OIL
1.0000 "application " | TOPICAL_OIL | Status: DC | PRN
  Filled 2017-04-13: qty 120

## 2017-04-13 MED ORDER — OXYCODONE-ACETAMINOPHEN 5-325 MG PO TABS: 2.0000 | ORAL_TABLET | ORAL | Status: DC | PRN

## 2017-04-13 MED ORDER — LIDOCAINE-EPINEPHRINE (PF) 1.5 %-1:200000 IJ SOLN
INTRAMUSCULAR | Status: DC | PRN
  Administered 2017-04-13: 3 mL via EPIDURAL

## 2017-04-13 MED ORDER — SODIUM CHLORIDE 0.9 % IV SOLN
INTRAVENOUS | Status: DC | PRN
  Administered 2017-04-13 (×4): 5 mL via EPIDURAL

## 2017-04-13 MED ORDER — OXYCODONE-ACETAMINOPHEN 5-325 MG PO TABS: 1.0000 | ORAL_TABLET | ORAL | Status: DC | PRN

## 2017-04-13 MED ORDER — LABETALOL HCL 5 MG/ML IV SOLN
20.0000 mg | Freq: Once | INTRAVENOUS | Status: AC
  Administered 2017-04-13: 20 mg via INTRAVENOUS

## 2017-04-13 NOTE — Discharge Instructions (Signed)

## 2017-04-13 NOTE — Progress Notes (Signed)
 80mg  of Labetalol given IV @ 1940 due to BP of 161/120 @ 1932.

## 2017-04-13 NOTE — Progress Notes (Signed)
Labetalol 40mg  given IV @ 1920 for BP of 151/112 @ 1910.

## 2017-04-13 NOTE — Discharge Summary (Signed)
OB Discharge Summary     Patient Name: Carla Hill DOB: 1996/04/26 MRN: [id number]  Date of admission: 04/12/2017 Delivering MD: Nadara Mustard, MD Date of Delivery: 04/12/2017  Date of discharge: 04/15/2017 Admitting diagnosis: 39 weeks, elevated 24 h urine Intrauterine pregnancy: [redacted]w[redacted]d     Secondary diagnosis: Preeclampsia     Discharge diagnosis: Term Pregnancy Delivered, Preeclampsia (severe)                         Hospital course:  Induction of Labor With Vaginal Delivery   21 y.o. yo G1P0000 at [redacted]w[redacted]d was admitted to the hospital 04/12/2017 for induction of labor.  Indication for induction: Preeclampsia.  Patient had an uncomplicated labor course as follows: Membrane Rupture Time/Date: 1:47 PM ,04/13/2017   Intrapartum Procedures: Episiotomy: None [1]                                         Lacerations:    Periurethral with repair Patient had delivery of a Viable infant.  Information for the patient's newborn:  Metha, Kolasa [[id number]]  Delivery Method: Vag-Spont   04/13/2017  Details of delivery can be found in separate delivery note.  Patient had a postpartum course complicated by pre-eclampsia with severe features; magnesium sulfate for 24 h following delivery. Blood pressures are now stable. Patient is discharged home 04/15/17.                                                                 Post partum procedures:none  Complications: None  Physical exam on 04/15/2017: Vitals:   04/15/17 0125 04/15/17 0302 04/15/17 0804 04/15/17 1023  BP: 127/88 129/90 128/83 127/86  Pulse: 85 85 82   Resp: 16 16    Temp: 97.8 F (36.6 C) 97.9 F (36.6 C) 98.5 F (36.9 C)   TempSrc: Oral Oral Oral   SpO2: 100% 100% 99%   Weight:      Height:       General: alert, cooperative and no distress Lochia: appropriate Uterine Fundus: firm Incision: N/A DVT Evaluation: No evidence of DVT seen on physical exam. No significant calf/ankle edema.  Labs: Lab Results   Component Value Date   WBC 16.4 (H) 04/14/2017   HGB 11.9 (L) 04/14/2017   HCT 35.0 04/14/2017   MCV 97.1 04/14/2017   PLT 203 04/14/2017   CMP Latest Ref Rng & Units 04/12/2017  Glucose 65 - 99 mg/dL 88  BUN 6 - 20 mg/dL <0(Q)  Creatinine 6.57 - 1.00 mg/dL 8.46  Sodium 962 - 952 mmol/L 138  Potassium 3.5 - 5.1 mmol/L 3.7  Chloride 101 - 111 mmol/L 109  CO2 22 - 32 mmol/L 22  Calcium 8.9 - 10.3 mg/dL 8.9  Total Protein 6.5 - 8.1 g/dL 8.4(X)  Total Bilirubin 0.3 - 1.2 mg/dL 0.6  Alkaline Phos 38 - 126 U/L 166(H)  AST 15 - 41 U/L 30  ALT 14 - 54 U/L 19    Discharge instruction: per After Visit Summary.  Medications:  Allergies as of 04/15/2017   No Known Allergies     Medication List    STOP taking these medications  brompheniramine-pseudoephedrine-DM 30-2-10 MG/5ML syrup   ibuprofen 800 MG tablet Commonly known as:  ADVIL,MOTRIN   predniSONE 50 MG tablet Commonly known as:  DELTASONE     TAKE these medications   albuterol 108 (90 Base) MCG/ACT inhaler Commonly known as:  PROVENTIL HFA;VENTOLIN HFA Inhale 2 puffs into the lungs every 4 (four) hours as needed for wheezing or shortness of breath.   multivitamin-prenatal 27-0.8 MG Tabs tablet Take 1 tablet by mouth daily at 12 noon.       Diet: routine diet  Activity: Advance as tolerated. Pelvic rest for 6 weeks.   Outpatient follow up: Follow-up Information    Nadara MustardHarris, Robert P, MD Follow up on 04/22/2017.   Specialty:  Obstetrics and Gynecology Why:  Please attend appointment for BP CHECK Friday at 1:50pm Contact information: [address] [place] [address] [phone]        Department, [facility]. Schedule an appointment as soon as possible for a visit in 6 week(s).   Why:  Postpartum check Contact information: [address] [place] [phone]             Postpartum contraception: Depo Provera Rhogam Given postpartum:  no Rubella vaccine given postpartum: no Varicella vaccine given postpartum: no TDaP given antepartum or postpartum: Yes (01/25/2017)  Newborn Data: Live born F (Autumn) Birth Weight: 2610 g APGAR: 8 ,9   Newborn Delivery   Birth date/time:  04/13/2017 23:40:00 Delivery type:  Vaginal, Spontaneous Delivery      Baby Feeding: Breast  Disposition:home with mother  SIGNED:  Oswaldo ConroyJacelyn Y Tamar Miano, CNM 04/15/2017 11:04 AM

## 2017-04-13 NOTE — Progress Notes (Signed)
  Labor Progress Note   21 y.o. G1P0000 @ 9011w2d , admitted for  Pregnancy, Labor Management. IOL, Preeclampsia  Subjective:  Comfortable on epidural BPs up and down, Labetolol earlier On Pitocin 20 mU/min  Objective:  BP (!) 144/92   Pulse (!) 103   Temp 98.1 F (36.7 C) (Oral)   Resp 20   Ht 5\' 6"  (1.676 m)   Wt 180 lb (81.6 kg)   LMP 07/12/2016 (Approximate)   SpO2 95%   BMI 29.05 kg/m  Abd: mild Extr: trace to 1+ bilateral pedal edema SVE: CERVIX: 9.5/10% effaced/+1  EFM: FHR: 130 bpm, variability: moderate,  accelerations:  Present,  decelerations:  Absent Toco: Frequency: Every 3-4 minutes Labs: I have reviewed the patient's lab results.   Assessment & Plan:  G1P0000 @ 5411w2d, admitted for  Pregnancy and Labor/Delivery Management  1. Pain management: epidural. 2. FWB: FHT category 1.  3. ID: GBS positive 4. Labor management: Start pushing soon  All discussed with patient, see orders  Annamarie MajorPaul Nadege Carriger, MD, Merlinda FrederickFACOG [facility], [facility] 04/13/2017  10:56 PM

## 2017-04-13 NOTE — Progress Notes (Signed)
PN- Epidural helps, min pain BP 130/90 current SVE- 4/70/-3 AROM clear 39 weeks, Preeclampsia IOL.  Cont Pitocin (on 9 mU/min) Cont epidural FWR Labor progress expected, alternatives discussed.

## 2017-04-13 NOTE — Anesthesia Procedure Notes (Signed)
Epidural Patient location during procedure: OB Start time: 04/13/2017 12:15 PM End time: 04/13/2017 12:31 PM  Staffing Anesthesiologist: Margorie JohnPISCITELLO, Afnan Emberton K Performed: anesthesiologist   Preanesthetic Checklist Completed: patient identified, site marked, surgical consent, pre-op evaluation, timeout performed, IV checked, risks and benefits discussed and monitors and equipment checked  Epidural Patient position: sitting Prep: Betadine Patient monitoring: heart rate, continuous pulse ox and blood pressure Approach: midline Location: L4-L5 Injection technique: LOR saline  Needle:  Needle type: Tuohy  Needle gauge: 17 G Needle length: 9 cm and 9 Needle insertion depth: 6.5 cm Catheter type: closed end flexible Catheter size: 19 Gauge Catheter at skin depth: 11.5 cm Test dose: negative and 1.5% lidocaine with Epi 1:200 K  Assessment Sensory level: T10 Events: blood not aspirated, injection not painful, no injection resistance, negative IV test and no paresthesia  Additional Notes Multiple attempts.  Patient had multiple left sided paresthesias that resolved with needle retraction and re direction.  Patient had trouble obtaining and holding proper positioning even with prompting.  Pt. Evaluated and documentation done after procedure finished. Patient identified. Risks/Benefits/Options discussed with patient including but not limited to bleeding, infection, nerve damage, paralysis, failed block, incomplete pain control, headache, blood pressure changes, nausea, vomiting, reactions to medication both or allergic, itching and postpartum back pain. Confirmed with bedside nurse the patient's most recent platelet count. Confirmed with patient that they are not currently taking any anticoagulation, have any bleeding history or any family history of bleeding disorders. Patient expressed understanding and wished to proceed. All questions were answered. Sterile technique was used throughout  the entire procedure. Please see nursing notes for vital signs. Test dose was given through epidural catheter and negative prior to continuing to dose epidural or start infusion. Warning signs of high block given to the patient including shortness of breath, tingling/numbness in hands, complete motor block, or any concerning symptoms with instructions to call for help. Patient was given instructions on fall risk and not to get out of bed. All questions and concerns addressed with instructions to call with any issues or inadequate analgesia.   Patient tolerated the insertion well without immediate complications.Reason for block:procedure for pain

## 2017-04-13 NOTE — Progress Notes (Signed)
  Labor Progress Note   21 y.o. G1P0000 @ 5174w2d , admitted for  Pregnancy, Labor Management. IOL for pre-eclampsia.  Subjective:  Patient is resting supine, alert and orientedx3, just received epidural, reports relief from pain of contractions.   Objective:  BP (!) 136/95   Pulse (!) 102   Temp 98 F (36.7 C) (Oral)   Resp 18   Ht 5\' 6"  (1.676 m)   Wt 180 lb (81.6 kg)   LMP 07/12/2016 (Approximate)   SpO2 97%   BMI 29.05 kg/m  Abd: mild Extr: trace to 1+ bilateral pedal edema SVE: RN exam: 4.5/70/-2  EFM: FHR: 125 bpm, variability: moderate,  accelerations:  Absent,  decelerations:  Present occasional variable Toco: Frequency: Every 2 minutes, Duration: 60 seconds and Intensity: moderate Labs: I have reviewed the patient's lab results.   Assessment & Plan:  G1P0000 @ 6674w2d, admitted for  Pregnancy and Labor/Delivery Management  1. Pain management: epidural. 2. FWB: FHT category II.  3. ID: GBS positive, continue IV PCN. 4. Pre-eclampsia. Repeat H&H is stable. Patient started on magnesium at 0915. Patient has had two doses of IV labetalol for severe range pressures at 0845 and 1242. Continue to monitor and administer IV antihypertensive medication per protocol. 4. Labor management: titrate Pitocin as appropriate, continue expectant management.  All discussed with patient, see orders  Marcelyn BruinsJacelyn Brandie Lopes, CNM 04/13/2017  1:07 PM

## 2017-04-13 NOTE — Anesthesia Preprocedure Evaluation (Signed)
Anesthesia Evaluation  Patient identified by MRN, date of birth, ID band Patient awake    Reviewed: Allergy & Precautions, H&P , NPO status , Patient's Chart, lab work & pertinent test results  History of Anesthesia Complications Negative for: history of anesthetic complications  Airway Mallampati: III  TM Distance: >3 FB Neck ROM: full    Dental  (+) Poor Dentition   Pulmonary asthma , former smoker,           Cardiovascular Exercise Tolerance: Good hypertension,      Neuro/Psych    GI/Hepatic negative GI ROS,   Endo/Other    Renal/GU   negative genitourinary   Musculoskeletal   Abdominal   Peds  Hematology negative hematology ROS (+)   Anesthesia Other Findings  History reviewed. No pertinent surgical history.  BMI    Body Mass Index:  29.05 kg/m      Reproductive/Obstetrics (+) Pregnancy                             Anesthesia Physical Anesthesia Plan  ASA: III  Anesthesia Plan: Epidural   Post-op Pain Management:    Induction:   PONV Risk Score and Plan:   Airway Management Planned:   Additional Equipment:   Intra-op Plan:   Post-operative Plan:   Informed Consent: I have reviewed the patients History and Physical, chart, labs and discussed the procedure including the risks, benefits and alternatives for the proposed anesthesia with the patient or authorized representative who has indicated his/her understanding and acceptance.     Plan Discussed with: Anesthesiologist  Anesthesia Plan Comments:         Anesthesia Quick Evaluation

## 2017-04-13 NOTE — Progress Notes (Signed)
  Labor Progress Note   21 y.o. G1P0000 @ 7524w2d , admitted for  Pregnancy, Labor Management. IOL for preeclampsia  Subjective:  Patient has gotten some sleep overnight. She occasionally feels her contractions.  Objective:  BP (!) 141/91   Pulse 86   Temp 98.2 F (36.8 C) (Oral)   Resp 18   Ht 5\' 6"  (1.676 m)   Wt 180 lb (81.6 kg)   LMP 07/12/2016 (Approximate)   SpO2 100%   BMI 29.05 kg/m  Abd: mild Extr: trace to 1+ bilateral pedal edema SVE: CERVIX: 2 cm dilated, 70 effaced, -2 station Cervical sweep and foley bulb placed  EFM: FHR: 130 bpm, variability: moderate,  accelerations:  Present,  decelerations:  Absent Toco: Frequency: Every 2-3 minutes Labs: I have reviewed the patient's lab results.   Assessment & Plan:  G1P0000 @ 4324w2d, admitted for  Pregnancy and Labor/Delivery Management  1. Pain management: none. Plans epidural 2. FWB: FHT category I.  3. ID: GBS positive penicillin prophylaxis 4. Labor management: foley bulb, start pitocin   All discussed with patient, see orders  Tresea MallJane Sheneika Walstad, CNM [facility], [facility] 04/13/2017  8:05 AM

## 2017-04-13 NOTE — Progress Notes (Signed)
Pharmacy phoned and spoke with Barbara CowerJason that there is no Labetalol in the pyxis. Reported that patient had elevated BP and needed medication ASAP, within 10 minutes. Barbara CowerJason stated that he will tubed up labetalol ASAP.

## 2017-04-14 DIAGNOSIS — D62 Acute posthemorrhagic anemia: Secondary | ICD-10-CM

## 2017-04-14 DIAGNOSIS — O9081 Anemia of the puerperium: Secondary | ICD-10-CM

## 2017-04-14 LAB — CBC
HCT: 35 % (ref 35.0–47.0)
Hemoglobin: 11.9 g/dL — ABNORMAL LOW (ref 12.0–16.0)
MCH: 33.1 pg (ref 26.0–34.0)
MCHC: 34.1 g/dL (ref 32.0–36.0)
MCV: 97.1 fL (ref 80.0–100.0)
Platelets: 203 10*3/uL (ref 150–440)
RBC: 3.6 MIL/uL — ABNORMAL LOW (ref 3.80–5.20)
RDW: 14.2 % (ref 11.5–14.5)
WBC: 16.4 10*3/uL — ABNORMAL HIGH (ref 3.6–11.0)

## 2017-04-14 LAB — RPR: RPR Ser Ql: NONREACTIVE

## 2017-04-14 MED ORDER — LABETALOL HCL 5 MG/ML IV SOLN
20.0000 mg | INTRAVENOUS | Status: DC | PRN
  Filled 2017-04-14: qty 16

## 2017-04-14 MED ORDER — HYDRALAZINE HCL 20 MG/ML IJ SOLN: 10.0000 mg | Freq: Once | INTRAMUSCULAR | Status: DC | PRN

## 2017-04-14 MED ORDER — LABETALOL HCL 5 MG/ML IV SOLN
INTRAVENOUS | Status: AC
  Filled 2017-04-14: qty 8

## 2017-04-14 NOTE — Progress Notes (Addendum)
   Magnesium Check Note  04/14/2017 - 12:20 PM  21 y.o. G1P0000.  Pregnancy complicated by preeclampsia with severe features.  Patient Active Problem List   Diagnosis Date Noted  . Indication for care in labor and delivery, antepartum 04/12/2017  . Labor and delivery, indication for care 04/12/2017      Subjective:  Patient denies headache, denies visual disturbances, denies RUQ pain, denies chest pain, denies shortness of breath. She is sitting up in bed holding baby.   Objective:   Vitals:   04/14/17 0924 04/14/17 0930 04/14/17 1030 04/14/17 1130  BP: (!) 140/100  129/87 136/81  Pulse: 90  86 95  Resp:  17 16 18   Temp:      TempSrc:      SpO2: 98% 99% 99% 99%  Weight:      Height:        Current Vital Signs 24h Vital Sign Ranges  T 98.2 F (36.8 C) Temp  Avg: 98.1 F (36.7 C)  Min: 97.6 F (36.4 C)  Max: 98.6 F (37 C)  BP 136/81 BP  Min: 121/74  Max: 170/154  HR 95 Pulse  Avg: 98.8  Min: 79  Max: 180  RR 18 Resp  Avg: 17  Min: 16  Max: 20  SaO2 99 % Not Delivered SpO2  Avg: 97.3 %  Min: 93 %  Max: 100 %       24 Hour I/O Current Shift I/O  Time Ins Outs 10/17 0701 - 10/18 0700 In: 8265 [P.O.:3785; I.V.:4353.8] Out: 7820 [Urine:7320] 10/18 0701 - 10/18 1900 In: 4050.8 [P.O.:3180; I.V.:870.8] Out: 2925 [Urine:2925]   Physical Exam: General: NAD Pulm: CTAB CV: RRR Abd: Soft, nontender, nondistended, +BS Ext: SCDs in place Neuro: DTRs 2+ brachioradialis  Labs:   Recent Labs Lab 04/12/17 1830 04/13/17 1119 04/14/17 0405  WBC 8.1 10.1 16.4*  HGB 12.8 13.5 11.9*  HCT 37.2 39.8 35.0  PLT 208 214 203    Recent Labs Lab 04/12/17 1830  NA 138  K 3.7  CL 109  CO2 22  BUN <5*  CREATININE 0.68  CALCIUM 8.9  PROT 5.5*  BILITOT 0.6  ALKPHOS 166*  ALT 19  AST 30  GLUCOSE 88    Medications SCHEDULED MEDICATIONS  . ibuprofen  600 mg Oral Q6H  . labetalol  200 mg Oral BID  . labetalol      . senna-docusate  2 tablet Oral Q24H  . sodium  chloride flush  3 mL Intravenous Q12H  . Tdap  0.5 mL Intramuscular Once    MEDICATION INFUSIONS  . sodium chloride    . lactated ringers 100 mL/hr at 04/14/17 0749  . magnesium sulfate 2 g/hr (04/14/17 0220)    PRN MEDICATIONS  sodium chloride flush **AND** sodium chloride flush **AND** sodium chloride, acetaminophen, benzocaine-Menthol, coconut oil, witch hazel-glycerin **AND** dibucaine, diphenhydrAMINE, hydrALAZINE, labetalol, ondansetron **OR** ondansetron (ZOFRAN) IV, oxyCODONE-acetaminophen, oxyCODONE-acetaminophen, simethicone, zolpidem   Assessment & Plan:  21 y.o. G1P0000 without signs of magnesium toxicity 1) preeclampsia with severe feature, postpartum: continue magnesium 2) plan for discontinuation of magnesium 24 hours from time of delivery. 3) continue postpartum care 4) O+, Rubella Immune, Varicella Immune 5) regular diet 6) TDAP given antepartum   Carla Hill, CNM 04/14/2017 12:20 PM  [facility]

## 2017-04-14 NOTE — Progress Notes (Signed)
Labetalol 20mg  given IV per protocol @ 0108 due to BP of 162/113 @ 0100 and 162/109 @0104 . Will continue to monitor BP. Denies HA, vision changes, and epigastric change at this time. 0100 mag assessment showed cleared lungs, reflexes plus 2, and negative clonus.

## 2017-04-14 NOTE — Anesthesia Postprocedure Evaluation (Signed)
Anesthesia Post Note  Patient: Carla Hill  Procedure(s) Performed: AN AD HOC LABOR EPIDURAL  Patient location during evaluation: L&D Anesthesia Type: Epidural Level of consciousness: awake and alert Pain management: pain level controlled Vital Signs Assessment: post-procedure vital signs reviewed and stable Respiratory status: spontaneous breathing, nonlabored ventilation and respiratory function stable Cardiovascular status: stable Postop Assessment: no headache, no backache and epidural receding Anesthetic complications: no     Last Vitals:  Vitals:   04/14/17 0700 04/14/17 0731  BP:  (!) 137/93  Pulse:  87  Resp:  17  Temp:  36.8 C  SpO2: 99% 98%    Last Pain:  Vitals:   04/14/17 0731  TempSrc: Oral  PainSc:                  Rica MastBachich,  Breta Demedeiros M

## 2017-04-15 ENCOUNTER — Encounter: Payer: Self-pay | Admitting: Emergency Medicine

## 2017-04-15 DIAGNOSIS — D62 Acute posthemorrhagic anemia: Secondary | ICD-10-CM

## 2017-04-15 DIAGNOSIS — O9081 Anemia of the puerperium: Secondary | ICD-10-CM

## 2017-04-15 NOTE — Progress Notes (Signed)
PPD#2 SVD following IOL for severe pre-eclampsia Subjective:  Well-appearing, voices no complaints.  Pain control is good with PRN medications. Voiding without difficulty. Denies headaches, epigastric pain, visual disturbances. Tolerating a regular diet. Ambulating well.  Objective:   Blood pressure 128/83, pulse 82, temperature 98.5 F (36.9 C), temperature source Oral, resp. rate 16, height _0  (1.676 m), weight 180 lb (81.6 kg), last menstrual period 07/12/2016, SpO2 99 %.   General: NAD Pulmonary: no increased work of breathing Abdomen: non-distended, non-tender Uterus:  fundus firm at U; lochia less than menses Laceration: well-approximated  Extremities: trace edema, no erythema, no tenderness, no signs of DVT  Vitals:   04/14/17 2335 04/15/17 0125 04/15/17 0302 04/15/17 0804  BP: 121/85 127/88 129/90 128/83  Pulse: 93 85 85 82  Resp: _1 Temp:  97.8 F (36.6 C) 97.9 F (36.6 C) 98.5 F (36.9 C)  TempSrc:  Oral Oral Oral  SpO2: 100% 100% 100% 99%  Weight:      Height:        Results for orders placed or performed during the hospital encounter of 04/12/17 (from the past 72 hour(s))  Protein / creatinine ratio, urine     Status: Abnormal   Collection Time: 04/12/17  6:12 PM  Result Value Ref Range   Creatinine, Urine 35 mg/dL   Total Protein, Urine 159 mg/dL    Comment: RESULT CONFIRMED BY MANUAL DILUTION JJB NO NORMAL RANGE ESTABLISHED FOR THIS TEST    Protein Creatinine Ratio 4.54 (H) 0.00 - 0.15 mg/mg[Cre]  CBC     Status: None   Collection Time: 04/12/17  6:30 PM  Result Value Ref Range   WBC 8.1 3.6 - 11.0 K/uL   RBC 3.88 3.80 - 5.20 MIL/uL   Hemoglobin 12.8 12.0 - 16.0 g/dL   HCT 37.2 35.0 - 47.0 %   MCV 95.8 80.0 - 100.0 fL   MCH 33.0 26.0 - 34.0 pg   MCHC 34.4 32.0 - 36.0 g/dL   RDW 13.6 11.5 - 14.5 %   Platelets 208 150 - 440 K/uL  Comprehensive metabolic panel     Status: Abnormal   Collection Time: 04/12/17  6:30 PM  Result Value Ref Range    Sodium 138 135 - 145 mmol/L   Potassium 3.7 3.5 - 5.1 mmol/L   Chloride 109 101 - 111 mmol/L   CO2 22 22 - 32 mmol/L   Glucose, Bld 88 65 - 99 mg/dL   BUN <5 (L) 6 - 20 mg/dL   Creatinine, Ser 0.68 0.44 - 1.00 mg/dL   Calcium 8.9 8.9 - 10.3 mg/dL   Total Protein 5.5 (L) 6.5 - 8.1 g/dL   Albumin 2.4 (L) 3.5 - 5.0 g/dL   AST 30 15 - 41 U/L   ALT 19 14 - 54 U/L   Alkaline Phosphatase 166 (H) 38 - 126 U/L   Total Bilirubin 0.6 0.3 - 1.2 mg/dL   GFR calc non Af Amer >60 >60 mL/min   GFR calc Af Amer >60 >60 mL/min    Comment: (NOTE) The eGFR has been calculated using the CKD EPI equation. This calculation has not been validated in all clinical situations. eGFR's persistently <60 mL/min signify possible Chronic Kidney Disease.    Anion gap 7 5 - 15  Chlamydia/NGC rt PCR ([facility] only)     Status: None   Collection Time: 04/12/17  8:16 PM  Result Value Ref Range   Specimen source GC/Chlam URINE, RANDOM  Chlamydia Tr NOT DETECTED NOT DETECTED   N gonorrhoeae NOT DETECTED NOT DETECTED    Comment: (NOTE) 100  This methodology has not been evaluated in pregnant women or in 200  patients with a history of hysterectomy. 300 400  This methodology will not be performed on patients less than 3  years of age.   Urine Drug Screen, Qualitative ([facility] only)     Status: None   Collection Time: 04/12/17  8:22 PM  Result Value Ref Range   Tricyclic, Ur Screen NONE DETECTED NONE DETECTED   Amphetamines, Ur Screen NONE DETECTED NONE DETECTED   MDMA (Ecstasy)Ur Screen NONE DETECTED NONE DETECTED   Cocaine Metabolite,Ur [place] NONE DETECTED NONE DETECTED   Opiate, Ur Screen NONE DETECTED NONE DETECTED   Phencyclidine (PCP) Ur S NONE DETECTED NONE DETECTED   Cannabinoid 50 Ng, Ur [place] NONE DETECTED NONE DETECTED   Barbiturates, Ur Screen NONE DETECTED NONE DETECTED   Benzodiazepine, Ur Scrn NONE DETECTED NONE DETECTED   Methadone Scn, Ur NONE DETECTED NONE DETECTED    Comment: (NOTE) 595   Tricyclics, urine               Cutoff 1000 ng/mL 200  Amphetamines, urine             Cutoff 1000 ng/mL 300  MDMA (Ecstasy), urine           Cutoff 500 ng/mL 400  Cocaine Metabolite, urine       Cutoff 300 ng/mL 500  Opiate, urine                   Cutoff 300 ng/mL 600  Phencyclidine (PCP), urine      Cutoff 25 ng/mL 700  Cannabinoid, urine              Cutoff 50 ng/mL 800  Barbiturates, urine             Cutoff 200 ng/mL 900  Benzodiazepine, urine           Cutoff 200 ng/mL 1000 Methadone, urine                Cutoff 300 ng/mL 1100 1200 The urine drug screen provides only a preliminary, unconfirmed 1300 analytical test result and should not be used for non-medical 1400 purposes. Clinical consideration and professional judgment should 1500 be applied to any positive drug screen result due to possible 1600 interfering substances. A more specific alternate chemical method 1700 must be used in order to obtain a confirmed analytical result.  1800 Gas chromato graphy / mass spectrometry (GC/MS) is the preferred 1900 confirmatory method.   RPR     Status: None   Collection Time: 04/12/17  9:31 PM  Result Value Ref Range   RPR Ser Ql Non Reactive Non Reactive    Comment: (NOTE) Performed At: [facility] [place], [place] [id number] Lindon Romp MD IR:[id number]   Type and screen     Status: None   Collection Time: 04/12/17 10:11 PM  Result Value Ref Range   ABO/RH(D) O POS    Antibody Screen NEG    Sample Expiration 04/15/2017   CBC     Status: None   Collection Time: 04/13/17 11:19 AM  Result Value Ref Range   WBC 10.1 3.6 - 11.0 K/uL   RBC 4.14 3.80 - 5.20 MIL/uL   Hemoglobin 13.5 12.0 - 16.0 g/dL   HCT 39.8 35.0 - 47.0 %   MCV 96.3 80.0 -  100.0 fL   MCH 32.7 26.0 - 34.0 pg   MCHC 33.9 32.0 - 36.0 g/dL   RDW 14.0 11.5 - 14.5 %   Platelets 214 150 - 440 K/uL  CBC     Status: Abnormal   Collection Time: 04/14/17  4:05 AM  Result Value Ref Range    WBC 16.4 (H) 3.6 - 11.0 K/uL   RBC 3.60 (L) 3.80 - 5.20 MIL/uL   Hemoglobin 11.9 (L) 12.0 - 16.0 g/dL   HCT 35.0 35.0 - 47.0 %   MCV 97.1 80.0 - 100.0 fL   MCH 33.1 26.0 - 34.0 pg   MCHC 34.1 32.0 - 36.0 g/dL   RDW 14.2 11.5 - 14.5 %   Platelets 203 150 - 440 K/uL    Assessment:   21 y.o. G1P0000 postpartum day # 2, in good condition.  Plan:   1) Acute blood loss anemia - hemodynamically stable and asymptomatic - PO ferrous sulfate  2) Blood Type --/--/O POS (10/16 2211) /   3) Rubella immune / Varicella immune/ TDAP status: given 01/25/2017  4) Breast feeding  5) Contraception: Depo-Provera  6) Disposition: Discharge home today when baby ready to discharge. 1 week f/u for BP check.  Avel Sensor, CNM 04/15/2017  9:59 AM

## 2017-04-15 NOTE — Progress Notes (Signed)
Reviewed all patients discharge instructions and handouts regarding postpartum bleeding, no intercourse for 6 weeks, signs and symptoms of mastitis and postpartum bleu's. Reviewed discharge instructions for newborn regarding proper cord care, how and when to bathe the newborn, nail care, proper way to take the baby's temperature, along with safe sleep. All questions have been answered at this time. Patient discharged via wheelchair with RN.  

## 2017-04-20 ENCOUNTER — Telehealth: Payer: Self-pay

## 2017-04-20 NOTE — Telephone Encounter (Signed)
Pt called after hour nurse last night to see if she needed to bring the baby with her to her postpartum exam, also has cold sxs but didn't want to be triaged. After hour nurse rec ibuprophen and saline nasal spray.  I called pt, has sinus congestion and sore throat.  Adv warm salt water gargles, sip hot beverages/soup, plain sudafed, saline nasal, e.s. Tylenol. As far as bringing baby to appt that is up to her but we would love to see the outcome.  However, it is supposed to be cold and rainy Fri she may want to keep baby at home.

## 2017-04-22 ENCOUNTER — Encounter: Payer: Self-pay | Admitting: Obstetrics & Gynecology

## 2017-04-22 ENCOUNTER — Ambulatory Visit (INDEPENDENT_AMBULATORY_CARE_PROVIDER_SITE_OTHER): Payer: Medicaid Other | Admitting: Obstetrics & Gynecology

## 2017-04-22 ENCOUNTER — Ambulatory Visit: Payer: 59 | Admitting: Obstetrics & Gynecology

## 2017-04-22 VITALS — BP 120/82 | Ht 66.0 in | Wt 152.0 lb

## 2017-04-22 DIAGNOSIS — O165 Unspecified maternal hypertension, complicating the puerperium: Secondary | ICD-10-CM

## 2017-04-22 HISTORY — DX: Unspecified maternal hypertension, complicating the puerperium: O16.5

## 2017-04-22 NOTE — Progress Notes (Signed)
  History of Present Illness:  Carla Hill is a 21 y.o. who was seen for high blood pressure with subsequent induction of labor, then delivery 1 weeks ago. Since that time, she states that her symptoms are improving. Denies headache, blurry vision, epigastric pain, CP, and SOB.  Had edema for 2 days PP then it has regressed.  No modifers or other context.  Reports she has cold-like sx's now, has taken Sudafed on 2 occasions, and NSAIDs.  PMHx: She  has no past medical history on file. Also,  has no past surgical history on file., family history is not on file.,  reports that she quit smoking about 7 months ago. Her smoking use included Cigarettes. She has never used smokeless tobacco. She reports that she does not drink alcohol or use drugs.  Current Outpatient Prescriptions:  .  albuterol (PROVENTIL HFA;VENTOLIN HFA) 108 (90 Base) MCG/ACT inhaler, Inhale 2 puffs into the lungs every 4 (four) hours as needed for wheezing or shortness of breath. (Patient not taking: Reported on 04/12/2017), Disp: 1 Inhaler, Rfl: 0 .  Prenatal Vit-Fe Fumarate-FA (MULTIVITAMIN-PRENATAL) 27-0.8 MG TABS tablet, Take 1 tablet by mouth daily at 12 noon., Disp: , Rfl:  Also, has No Known Allergies..  Review of Systems  Constitutional: Negative for chills, fever and malaise/fatigue.  HENT: Negative for congestion, sinus pain and sore throat.   Eyes: Negative for blurred vision and pain.  Respiratory: Negative for cough and wheezing.   Cardiovascular: Negative for chest pain and leg swelling.  Gastrointestinal: Negative for abdominal pain, constipation, diarrhea, heartburn, nausea and vomiting.  Genitourinary: Negative for dysuria, frequency, hematuria and urgency.  Musculoskeletal: Negative for back pain, joint pain, myalgias and neck pain.  Skin: Negative for itching and rash.  Neurological: Negative for dizziness, tremors and weakness.  Endo/Heme/Allergies: Does not bruise/bleed easily.  Psychiatric/Behavioral:  Negative for depression. The patient is not nervous/anxious and does not have insomnia.    Physical Exam:  BP 120/82   Ht 5\' 6"  (1.676 m)   Wt 152 lb (68.9 kg)   BMI 24.53 kg/m  Body mass index is 24.53 kg/m. Constitutional: Well nourished, well developed female in no acute distress.  Abdomen: diffusely non tender to palpation, non distended, and no masses, hernias Neuro: Grossly intact Psych:  Normal mood and affect.    Assessment:  Problem List Items Addressed This Visit      Cardiovascular and Mediastinum   Hypertension complicating pregnancy, childbirth and puerperium with baby delivered and postnatal complication - Primary    Plan: Pt has no s/sx preeclampsia at this time.  Has even tolerated Sudafed without evidence for escalating blood pressures. Counseled as the risks of post partum hypertension even preeclampsia, with sx's to monitor for. Contraception also discussed, she is considering minipill vs depo.  Breast feeding. She will follow up with her prenatal provider ([facility]) for PP check and contraception. A total of 15 minutes were spent face-to-face with the patient during this encounter and over half of that time dealt with counseling and coordination of care.  Annamarie MajorPaul Eufelia Veno, MD, Merlinda FrederickFACOG [facility], [facility] 04/22/2017  8:56 AM

## 2017-05-16 ENCOUNTER — Ambulatory Visit: Payer: Self-pay | Admitting: Family Medicine

## 2017-05-27 ENCOUNTER — Encounter: Payer: Self-pay | Admitting: Family Medicine

## 2017-05-27 ENCOUNTER — Ambulatory Visit: Payer: Medicaid Other | Admitting: Family Medicine

## 2017-05-27 VITALS — BP 106/75 | HR 73 | Temp 98.6°F | Ht 66.0 in | Wt 153.8 lb

## 2017-05-27 DIAGNOSIS — Z7689 Persons encountering health services in other specified circumstances: Secondary | ICD-10-CM

## 2017-05-27 NOTE — Progress Notes (Signed)
BP 106/75 (BP Location: Right Arm, Patient Position: Sitting, Cuff Size: Normal)   Pulse 73   Temp 98.6 F (37 C) (Oral)   Ht 5\' 6"  (1.676 m)   Wt 153 lb 12.8 oz (69.8 kg)   SpO2 99%   BMI 24.82 kg/m    Subjective:    Patient ID: Carla Hill, female    DOB: 05/16/1996, 21 y.o.   MRN: [id number]  HPI: Carla Hill is a 21 y.o. female  Chief Complaint  Patient presents with  . Establish Care    Patient had child 04/13/17. It's the 6th week and would like to know if her stitches are dissolved or not.   Patient presents today to establish care. No concerns today other than wanting to check on her sutures from childbirth. Has not had a 6 week post partum check with the health dept, where she received her prenatal care.   No known medical problems, not taking any medications.   Relevant past medical, surgical, family and social history reviewed and updated as indicated. Interim medical history since our last visit reviewed. Allergies and medications reviewed and updated.  Review of Systems  Constitutional: Negative.   HENT: Negative.   Respiratory: Negative.   Cardiovascular: Negative.   Gastrointestinal: Negative.   Musculoskeletal: Negative.   Skin: Negative.   Neurological: Negative.   Psychiatric/Behavioral: Negative.    Per HPI unless specifically indicated above     Objective:    BP 106/75 (BP Location: Right Arm, Patient Position: Sitting, Cuff Size: Normal)   Pulse 73   Temp 98.6 F (37 C) (Oral)   Ht 5\' 6"  (1.676 m)   Wt 153 lb 12.8 oz (69.8 kg)   SpO2 99%   BMI 24.82 kg/m   Wt Readings from Last 3 Encounters:  05/27/17 153 lb 12.8 oz (69.8 kg)  04/22/17 152 lb (68.9 kg)  04/12/17 180 lb (81.6 kg)    Physical Exam  Constitutional: She is oriented to person, place, and time. She appears well-developed and well-nourished.  HENT:  Head: Atraumatic.  Eyes: Conjunctivae are normal. Pupils are equal, round, and reactive to light.  Neck: Normal range  of motion. Neck supple.  Cardiovascular: Normal rate and normal heart sounds.  Pulmonary/Chest: Effort normal and breath sounds normal. No respiratory distress.  Musculoskeletal: Normal range of motion.  Neurological: She is alert and oriented to person, place, and time.  Skin: Skin is warm and dry.  Psychiatric: She has a normal mood and affect. Her behavior is normal.  Nursing note and vitals reviewed.  Results for orders placed or performed during the hospital encounter of 04/12/17  Chlamydia/NGC rt PCR ([facility] only)  Result Value Ref Range   Specimen source GC/Chlam URINE, RANDOM    Chlamydia Tr NOT DETECTED NOT DETECTED   N gonorrhoeae NOT DETECTED NOT DETECTED  CBC  Result Value Ref Range   WBC 8.1 3.6 - 11.0 K/uL   RBC 3.88 3.80 - 5.20 MIL/uL   Hemoglobin 12.8 12.0 - 16.0 g/dL   HCT 40.937.2 81.135.0 - 91.447.0 %   MCV 95.8 80.0 - 100.0 fL   MCH 33.0 26.0 - 34.0 pg   MCHC 34.4 32.0 - 36.0 g/dL   RDW 78.213.6 95.611.5 - 21.314.5 %   Platelets 208 150 - 440 K/uL  Comprehensive metabolic panel  Result Value Ref Range   Sodium 138 135 - 145 mmol/L   Potassium 3.7 3.5 - 5.1 mmol/L   Chloride 109 101 - 111 mmol/L  CO2 22 22 - 32 mmol/L   Glucose, Bld 88 65 - 99 mg/dL   BUN <5 (L) 6 - 20 mg/dL   Creatinine, Ser 2.420.68 0.44 - 1.00 mg/dL   Calcium 8.9 8.9 - 68.310.3 mg/dL   Total Protein 5.5 (L) 6.5 - 8.1 g/dL   Albumin 2.4 (L) 3.5 - 5.0 g/dL   AST 30 15 - 41 U/L   ALT 19 14 - 54 U/L   Alkaline Phosphatase 166 (H) 38 - 126 U/L   Total Bilirubin 0.6 0.3 - 1.2 mg/dL   GFR calc non Af Amer >60 >60 mL/min   GFR calc Af Amer >60 >60 mL/min   Anion gap 7 5 - 15  Protein / creatinine ratio, urine  Result Value Ref Range   Creatinine, Urine 35 mg/dL   Total Protein, Urine 159 mg/dL   Protein Creatinine Ratio 4.54 (H) 0.00 - 0.15 mg/mg[Cre]  RPR  Result Value Ref Range   RPR Ser Ql Non Reactive Non Reactive  Urine Drug Screen, Qualitative ([facility] only)  Result Value Ref Range   Tricyclic, Ur Screen NONE  DETECTED NONE DETECTED   Amphetamines, Ur Screen NONE DETECTED NONE DETECTED   MDMA (Ecstasy)Ur Screen NONE DETECTED NONE DETECTED   Cocaine Metabolite,Ur [place] NONE DETECTED NONE DETECTED   Opiate, Ur Screen NONE DETECTED NONE DETECTED   Phencyclidine (PCP) Ur S NONE DETECTED NONE DETECTED   Cannabinoid 50 Ng, Ur [place] NONE DETECTED NONE DETECTED   Barbiturates, Ur Screen NONE DETECTED NONE DETECTED   Benzodiazepine, Ur Scrn NONE DETECTED NONE DETECTED   Methadone Scn, Ur NONE DETECTED NONE DETECTED  CBC  Result Value Ref Range   WBC 10.1 3.6 - 11.0 K/uL   RBC 4.14 3.80 - 5.20 MIL/uL   Hemoglobin 13.5 12.0 - 16.0 g/dL   HCT 41.939.8 62.235.0 - 29.747.0 %   MCV 96.3 80.0 - 100.0 fL   MCH 32.7 26.0 - 34.0 pg   MCHC 33.9 32.0 - 36.0 g/dL   RDW 98.914.0 21.111.5 - 94.114.5 %   Platelets 214 150 - 440 K/uL  CBC  Result Value Ref Range   WBC 16.4 (H) 3.6 - 11.0 K/uL   RBC 3.60 (L) 3.80 - 5.20 MIL/uL   Hemoglobin 11.9 (L) 12.0 - 16.0 g/dL   HCT 74.035.0 81.435.0 - 48.147.0 %   MCV 97.1 80.0 - 100.0 fL   MCH 33.1 26.0 - 34.0 pg   MCHC 34.1 32.0 - 36.0 g/dL   RDW 85.614.2 31.411.5 - 97.014.5 %   Platelets 203 150 - 440 K/uL  Type and screen  Result Value Ref Range   ABO/RH(D) O POS    Antibody Screen NEG    Sample Expiration 04/15/2017       Assessment & Plan:   Problem List Items Addressed This Visit    None    Visit Diagnoses    Encounter to establish care    -  Primary   Discussed that she should make her 6 wk postpartum f/u with Health Dept. Will get records sent over. Pt not wanting CPEs, just wants to f/u as needed       Follow up plan: Return if symptoms worsen or fail to improve.

## 2017-05-27 NOTE — Patient Instructions (Signed)
Follow up as needed

## 2017-06-01 ENCOUNTER — Ambulatory Visit (INDEPENDENT_AMBULATORY_CARE_PROVIDER_SITE_OTHER): Payer: Medicaid Other | Admitting: Obstetrics & Gynecology

## 2017-06-01 ENCOUNTER — Encounter: Payer: Self-pay | Admitting: Obstetrics & Gynecology

## 2017-06-01 MED ORDER — NORETHINDRONE 0.35 MG PO TABS: 1.0000 | ORAL_TABLET | Freq: Every day | ORAL | 11 refills | Status: DC

## 2017-06-01 NOTE — Patient Instructions (Signed)
Norethindrone tablets (contraception) What is this medicine? NORETHINDRONE (nor eth IN drone) is an oral contraceptive. The product contains a female hormone known as a progestin. It is used to prevent pregnancy. This medicine may be used for other purposes; ask your health care provider or pharmacist if you have questions. COMMON BRAND NAME(S): Camila, Deblitane 28-Day, Errin, Heather, Jencycla, Jolivette, Lyza, Nor-QD, Nora-BE, Norlyroc, Ortho Micronor, Sharobel 28-Day What should I tell my health care provider before I take this medicine? They need to know if you have any of these conditions: -blood vessel disease or blood clots -breast, cervical, or vaginal cancer -diabetes -heart disease -kidney disease -liver disease -mental depression -migraine -seizures -stroke -vaginal bleeding -an unusual or allergic reaction to norethindrone, other medicines, foods, dyes, or preservatives -pregnant or trying to get pregnant -breast-feeding How should I use this medicine? Take this medicine by mouth with a glass of water. You may take it with or without food. Follow the directions on the prescription label. Take this medicine at the same time each day and in the order directed on the package. Do not take your medicine more often than directed. Contact your pediatrician regarding the use of this medicine in children. Special care may be needed. This medicine has been used in female children who have started having menstrual periods. A patient package insert for the product will be given with each prescription and refill. Read this sheet carefully each time. The sheet may change frequently. Overdosage: If you think you have taken too much of this medicine contact a poison control center or emergency room at once. NOTE: This medicine is only for you. Do not share this medicine with others. What if I miss a dose? Try not to miss a dose. Every time you miss a dose or take a dose late your chance of  pregnancy increases. When 1 pill is missed (even if only 3 hours late), take the missed pill as soon as possible and continue taking a pill each day at the regular time (use a back up method of birth control for the next 48 hours). If more than 1 dose is missed, use an additional birth control method for the rest of your pill pack until menses occurs. Contact your health care professional if more than 1 dose has been missed. What may interact with this medicine? Do not take this medicine with any of the following medications: -amprenavir or fosamprenavir -bosentan This medicine may also interact with the following medications: -antibiotics or medicines for infections, especially rifampin, rifabutin, rifapentine, and griseofulvin, and possibly penicillins or tetracyclines -aprepitant -barbiturate medicines, such as phenobarbital -carbamazepine -felbamate -modafinil -oxcarbazepine -phenytoin -ritonavir or other medicines for HIV infection or AIDS -St. John's wort -topiramate This list may not describe all possible interactions. Give your health care provider a list of all the medicines, herbs, non-prescription drugs, or dietary supplements you use. Also tell them if you smoke, drink alcohol, or use illegal drugs. Some items may interact with your medicine. What should I watch for while using this medicine? Visit your doctor or health care professional for regular checks on your progress. You will need a regular breast and pelvic exam and Pap smear while on this medicine. Use an additional method of birth control during the first cycle that you take these tablets. If you have any reason to think you are pregnant, stop taking this medicine right away and contact your doctor or health care professional. If you are taking this medicine for hormone related problems, it   may take several cycles of use to see improvement in your condition. This medicine does not protect you against HIV infection (AIDS)  or any other sexually transmitted diseases. What side effects may I notice from receiving this medicine? Side effects that you should report to your doctor or health care professional as soon as possible: -breast tenderness or discharge -pain in the abdomen, chest, groin or leg -severe headache -skin rash, itching, or hives -sudden shortness of breath -unusually weak or tired -vision or speech problems -yellowing of skin or eyes Side effects that usually do not require medical attention (report to your doctor or health care professional if they continue or are bothersome): -changes in sexual desire -change in menstrual flow -facial hair growth -fluid retention and swelling -headache -irritability -nausea -weight gain or loss This list may not describe all possible side effects. Call your doctor for medical advice about side effects. You may report side effects to FDA at 1-800-FDA-1088. Where should I keep my medicine? Keep out of the reach of children. Store at room temperature between 15 and 30 degrees C (59 and 86 degrees F). Throw away any unused medicine after the expiration date. NOTE: This sheet is a summary. It may not cover all possible information. If you have questions about this medicine, talk to your doctor, pharmacist, or health care provider.  2018 Elsevier/Gold Standard (2012-03-03 16:41:35)  

## 2017-06-01 NOTE — Progress Notes (Signed)
  OBSTETRICS POSTPARTUM CLINIC PROGRESS NOTE  Subjective:     Carla Hill is a 21 y.o. [id number] female who presents for a postpartum visit. She is 6 weeks postpartum following a Term pregnancy and delivery by Vaginal problems after delivery including gest HTN.  I have fully reviewed the prenatal and intrapartum course. Anesthesia: epidural.  Postpartum course has been complicated by uncomplicated.  Baby is feeding by Breast.  Bleeding: patient has not  resumed menses.  Bowel function is normal. Bladder function is normal.  Patient is not sexually active. Contraception method desired is oral progesterone-only contraceptive.  Postpartum depression screening: negative. [place] 0.  The following portions of the patient's history were reviewed and updated as appropriate: allergies, current medications, past family history, past medical history, past social history, past surgical history and problem list.  Review of Systems Pertinent items are noted in HPI.  Objective:    BP 110/70   Pulse 80   Ht 5\' 6"  (1.676 m)   Wt 156 lb (70.8 kg)   BMI 25.18 kg/m   General:  alert and no distress   Breasts:  inspection negative, no nipple discharge or bleeding, no masses or nodularity palpable  Lungs: clear to auscultation bilaterally  Heart:  regular rate and rhythm, S1, S2 normal, no murmur, click, rub or gallop  Abdomen: soft, non-tender; bowel sounds normal; no masses,  no organomegaly.     Vulva:  normal  Vagina: normal vagina, no discharge, exudate, lesion, or erythema  Cervix:  no cervical motion tenderness and no lesions  Corpus: normal size, contour, position, consistency, mobility, non-tender  Adnexa:  normal adnexa and no mass, fullness, tenderness  Rectal Exam: Not performed.        Assessment:  Post Partum Care visit 1. Encounter for postpartum visit Birth control PAP Plan:  See orders and Patient Instructions Follow up in: 12 months or as needed.   Annamarie MajorPaul Kolsen Choe, MD,  Merlinda FrederickFACOG [facility], [facility] 06/01/2017  2:19 PM

## 2017-06-04 LAB — IGP, CTNG, RFX APTIMA HPV ASCU
Chlamydia, Nuc. Acid Amp: NEGATIVE
Gonococcus by Nucleic Acid Amp: NEGATIVE
PAP Smear Comment: 0

## 2018-02-24 DIAGNOSIS — Z30013 Encounter for initial prescription of injectable contraceptive: Secondary | ICD-10-CM | POA: Diagnosis not present

## 2018-02-24 DIAGNOSIS — Z3009 Encounter for other general counseling and advice on contraception: Secondary | ICD-10-CM | POA: Diagnosis not present

## 2018-08-25 DIAGNOSIS — Z3009 Encounter for other general counseling and advice on contraception: Secondary | ICD-10-CM | POA: Diagnosis not present

## 2018-08-25 DIAGNOSIS — Z30013 Encounter for initial prescription of injectable contraceptive: Secondary | ICD-10-CM | POA: Diagnosis not present

## 2018-11-23 DIAGNOSIS — Z3009 Encounter for other general counseling and advice on contraception: Secondary | ICD-10-CM | POA: Diagnosis not present

## 2018-11-23 DIAGNOSIS — Z30013 Encounter for initial prescription of injectable contraceptive: Secondary | ICD-10-CM | POA: Diagnosis not present

## 2019-01-25 ENCOUNTER — Other Ambulatory Visit: Payer: Self-pay

## 2019-01-25 DIAGNOSIS — R6889 Other general symptoms and signs: Secondary | ICD-10-CM | POA: Diagnosis not present

## 2019-01-25 DIAGNOSIS — Z20822 Contact with and (suspected) exposure to covid-19: Secondary | ICD-10-CM

## 2019-01-28 LAB — NOVEL CORONAVIRUS, NAA: SARS-CoV-2, NAA: NOT DETECTED

## 2019-02-15 ENCOUNTER — Ambulatory Visit: Payer: Medicaid Other | Admitting: Family Medicine

## 2019-02-15 ENCOUNTER — Telehealth: Payer: Self-pay | Admitting: Family Medicine

## 2019-02-15 ENCOUNTER — Other Ambulatory Visit: Payer: Self-pay

## 2019-02-15 ENCOUNTER — Encounter: Payer: Self-pay | Admitting: Family Medicine

## 2019-02-15 VITALS — BP 120/83 | HR 77 | Temp 98.5°F | Ht 66.0 in | Wt 148.0 lb

## 2019-02-15 DIAGNOSIS — L509 Urticaria, unspecified: Secondary | ICD-10-CM

## 2019-02-15 MED ORDER — CETIRIZINE HCL 10 MG PO TABS: 10.0000 mg | ORAL_TABLET | Freq: Every day | ORAL | 11 refills | Status: DC

## 2019-02-15 MED ORDER — PREDNISONE 50 MG PO TABS: 50.0000 mg | ORAL_TABLET | Freq: Every day | ORAL | 0 refills | Status: DC

## 2019-02-15 NOTE — Progress Notes (Signed)
BP 120/83   Pulse 77   Temp 98.5 F (36.9 C) (Oral)   Ht 5\' 6"  (1.676 m)   Wt 148 lb (67.1 kg)   SpO2 100%   BMI 23.89 kg/m    Subjective:    Patient ID: Carla Hill, female    DOB: 1995/09/18, 23 y.o.   MRN: [id number]  HPI: Carla Hill is a 23 y.o. female  Chief Complaint  Patient presents with  . Rash    started on ears 2 days ago and moves to face and neck, states it feels irritated. Tried OTC calomine lotion. 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 She has only been trying calomine lotion OTC. Has been helping and she is not as itchy any more.  Duration:  2 days  Location: face  Itching: yes Burning: yes Redness: no Oozing: no Scaling: no Blisters: no Painful: no Fevers: no Change in detergents/soaps/personal care products: no Recent illness: no Recent travel:no History of same: yes Context: better Alleviating factors: lotion/moisturizer Treatments attempted:lotion/moisturizer Shortness of breath: no  Throat/tongue swelling: no Myalgias/arthralgias: no  Relevant past medical, surgical, family and social history reviewed and updated as indicated. Interim medical history since our last visit reviewed. Allergies and medications reviewed and updated.  Review of Systems  Constitutional: Negative.   Respiratory: Negative.   Cardiovascular: Negative.   Musculoskeletal: Negative.   Skin: Positive for rash. Negative for color change, pallor and wound.  Psychiatric/Behavioral: Negative.     Per HPI unless specifically indicated above     Objective:    BP 120/83   Pulse 77   Temp 98.5 F (36.9 C) (Oral)   Ht 5\' 6"  (1.676 m)   Wt 148 lb (67.1 kg)   SpO2 100%   BMI 23.89 kg/m   Wt Readings from Last 3 Encounters:  02/15/19 148 lb (67.1 kg)  06/01/17 156 lb (70.8 kg)  05/27/17 153 lb 12.8 oz (69.8 kg)    Physical Exam Vitals signs and nursing note reviewed.  Constitutional:      General: She is not in acute  distress.    Appearance: Normal appearance. She is not ill-appearing, toxic-appearing or diaphoretic.  HENT:     Head: Normocephalic and atraumatic.     Right Ear: External ear normal.     Left Ear: External ear normal.     Nose: Nose normal.     Mouth/Throat:     Mouth: Mucous membranes are moist.     Pharynx: Oropharynx is clear.  Eyes:     General: No scleral icterus.       Right eye: No discharge.        Left eye: No discharge.     Extraocular Movements: Extraocular movements intact.     Conjunctiva/sclera: Conjunctivae normal.     Pupils: Pupils are equal, round, and reactive to light.  Neck:     Musculoskeletal: Normal range of motion and neck supple.  Cardiovascular:     Rate and Rhythm: Normal rate and regular rhythm.     Pulses: Normal pulses.     Heart sounds: Normal heart sounds. No murmur. No friction rub. No gallop.   Pulmonary:     Effort: Pulmonary effort is normal. No respiratory distress.     Breath sounds: Normal breath sounds. No stridor. No wheezing, rhonchi or rales.  Chest:     Chest wall: No tenderness.  Musculoskeletal: Normal range of motion.  Skin:  General: Skin is warm and dry.     Capillary Refill: Capillary refill takes less than 2 seconds.     Coloration: Skin is not jaundiced or pale.     Findings: Rash present. No bruising, erythema or lesion.     Comments: fine papular rash on face, ears and neck-  Neurological:     General: No focal deficit present.     Mental Status: She is alert and oriented to person, place, and time. Mental status is at baseline.  Psychiatric:        Mood and Affect: Mood normal.        Behavior: Behavior normal.        Thought Content: Thought content normal.        Judgment: Judgment normal.     Results for orders placed or performed in visit on 01/25/19  Novel Coronavirus, NAA ([facility])  Result Value Ref Range   SARS-CoV-2, NAA Not Detected Not Detected      Assessment & Plan:   Problem List Items  Addressed This Visit    None    Visit Diagnoses    Urticaria    -  Primary   Will treat with burst of prednisone and zyrtec- benadryl at night. Call if not improving. Continue to monitor. Avoid honey.       Follow up plan: Return ASAP physical with PCP.

## 2019-02-15 NOTE — Telephone Encounter (Signed)
Copied from [place] [id number]. Topic: Appointment Scheduling - Scheduling Inquiry for Clinic >> Feb 14, 2019 12:25 PM Rainey Pines A wrote: Patient is requesting an appointment today for an allergic reaction. Patient has broken out into hives and feels that her ears are "on fire" for 3 days now. 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 An appt has been made but she would like to know if there is anything she could put on her face to stop the itching.

## 2019-02-15 NOTE — Patient Instructions (Signed)
Food Allergy  A food allergy is when your body reacts to a food in a way that is not normal. The reaction can be mild or very bad. A very bad allergic reaction is called an anaphylactic reaction (anaphylaxis). A very bad reaction is an emergency. What are the causes? Common foods that can cause a reaction are:  Milk.  Eggs.  Peanuts.  Tree nuts. These include pecans, walnuts, and cashews.  Seafood.  Wheat.  Soy. What are the signs or symptoms? Signs of a mild reaction  Stuffy nose.  Tingling in the mouth.  An itchy, red rash.  Throwing up (vomiting).  Watery poop (diarrhea). Signs of a very bad reaction  Itchy, red, swollen areas of skin (hives).  Swelling of your: ? Eyes. ? Lips. ? Face. ? Mouth. ? Tongue. ? Throat.  Trouble with: ? Breathing. ? Talking. ? Swallowing.  Noisy breathing (wheezing).  Passing out (fainting).  Having any of these feelings: ? Warmth in your face (flushed). ? Dizziness. ? Light-headedness.  Pain in your belly. Follow these instructions at home: If you are being tested for an allergy:  Avoid foods as told by your doctor (elimination diet).  Write down what you eat and drink in a notebook (food diary). Each day, write: ? What you eat and drink and when. ? What problems you have and when. If you have a very bad allergy:   Wear a bracelet or necklace that says you have an allergy.  Carry your allergy kit (anaphylaxis kit) or an allergy shot (epinephrine injection) with you all the time. Use them as told by your doctor.  Make sure that you, your family, and your boss know: ? The signs of a very bad reaction. ? How to use your allergy kit. ? How to give an allergy shot.  If you use an allergy shot: ? Get more right away in case you have another reaction. ? Get help. You can have a life-threatening reaction after taking the medicine (rebound anaphylaxis). General instructions  Avoid the foods that you are allergic  to.  Read food labels. Look for ingredients that you are allergic to.  When you are at a restaurant, tell your server that you have an allergy. Ask if your meal has an ingredient that you are allergic to.  Take medicines only as told by your doctor. Do not drive until the medicine has worn off, unless your doctor says it is okay.  Tell all people who care for you that you have a food allergy. This includes your doctor and dentist.  If you think that you might be allergic to something else, talk with your doctor. Do not eat a food to see if you are allergic to it without talking with your doctor first. Contact a doctor if you:  Have signs of a reaction that have not gone away after 2 days.  Get worse.  Have new signs of a reaction. Get help right away if you have signs of a very bad reaction:  Itchy, red, swollen areas of skin.  Swelling of your: ? Eyes. ? Lips. ? Face. ? Mouth. ? Tongue. ? Throat.  Trouble with: ? Breathing. ? Talking. ? Swallowing.  Noisy breathing (wheezing).  Passing out (fainting).  Having any of these feelings: ? Warmth in your face (flushed). ? Dizziness. ? Light-headedness. ? Pain in your belly. These signs may be an emergency. Do not wait to see if the signs will go away. Use your allergy shot  or allergy kit as you have been told. Get medical help right away. Call your local emergency services (911 in the [place] Do not drive yourself to the hospital. If you had to use your allergy pen, you must go to the emergency room even if the medicine seems to be working. This is important because another allergic reaction may happen within 3 days. Summary  A food allergy is when your body reacts to a food in a way that is not normal.  Avoid the foods that you are allergic to.  Wear a bracelet or necklace that says you have an allergy.  Carry your allergy kit (anaphylaxis kit) or an allergy shot (epinephrine injection) with you all the time. Use them  as told by your doctor. This information is not intended to replace advice given to you by your health care provider. Make sure you discuss any questions you have with your health care provider. Document Released: 12/02/2009 Document Revised: 06/14/2017 Document Reviewed: 06/14/2017 Elsevier Patient Education  [place] (urticaria) are itchy, red, swollen areas on the skin. Hives can appear on any part of the body. Hives often fade within 24 hours (acute hives). Sometimes, new hives appear after old ones fade and the cycle can continue for several days or weeks (chronic hives). Hives do not spread from person to person (are not contagious). Hives come from the body's reaction to something a person is allergic to (allergen), something that causes irritation, or various other triggers. When a person is exposed to a trigger, his or her body releases a chemical (histamine) that causes redness, itching, and swelling. Hives can appear right after exposure to a trigger or hours later. What are the causes? This condition may be caused by:  Allergies to foods or ingredients.  Insect bites or stings.  Exposure to pollen or pets.  Contact with latex or chemicals.  Spending time in sunlight, heat, or cold (exposure).  Exercise.  Stress.  Certain medicines. You can also get hives from other medical conditions and treatments, such as:  Viruses, including the common cold.  Bacterial infections, such as urinary tract infections and strep throat.  Certain medicines.  Allergy shots.  Blood transfusions. Sometimes, the cause of this condition is not known (idiopathic hives). What increases the risk? You are more likely to develop this condition if you:  Are a woman.  Have food allergies, especially to citrus fruits, milk, eggs, peanuts, tree nuts, or shellfish.  Are allergic to: ? Medicines. ? Latex. ? Insects. ? Animals. ? Pollen. What are the signs or  symptoms? Common symptoms of this condition include raised, itchy, red or white bumps or patches on your skin. These areas may:  Become large and swollen (welts).  Change in shape and location, quickly and repeatedly.  Be separate hives or connect over a large area of skin.  Sting or become painful.  Turn white when pressed in the center (blanch). In severe cases, yourhands, feet, and face may also become swollen. This may occur if hives develop deeper in your skin. How is this diagnosed? This condition may be diagnosed by your symptoms, medical history, and physical exam.  Your skin, urine, or blood may be tested to find out what is causing your hives and to rule out other health issues.  Your health care provider may also remove a small sample of skin from the affected area and examine it under a microscope (biopsy). How is this treated? Treatment for this condition  depends on the cause and severity of your symptoms. Your health care provider may recommend using cool, wet cloths (cool compresses) or taking cool showers to relieve itching. Treatment may include:  Medicines that help: ? Relieve itching (antihistamines). ? Reduce swelling (corticosteroids). ? Treat infection (antibiotics).  An injectable medicine (omalizumab). Your health care provider may prescribe this if you have chronic idiopathic hives and you continue to have symptoms even after treatment with antihistamines. Severe cases may require an emergency injection of adrenaline (epinephrine) to prevent a life-threatening allergic reaction (anaphylaxis). Follow these instructions at home: Medicines  Take and apply over-the-counter and prescription medicines only as told by your health care provider.  If you were prescribed an antibiotic medicine, take it as told by your health care provider. Do not stop using the antibiotic even if you start to feel better. Skin care  Apply cool compresses to the affected areas.   Do not scratch or rub your skin. General instructions  Do not take hot showers or baths. This can make itching worse.  Do not wear tight-fitting clothing.  Use sunscreen and wear protective clothing when you are outside.  Avoid any substances that cause your hives. Keep a journal to help track what causes your hives. Write down: ? What medicines you take. ? What you eat and drink. ? What products you use on your skin.  Keep all follow-up visits as told by your health care provider. This is important. Contact a health care provider if:  Your symptoms are not controlled with medicine.  Your joints are painful or swollen. Get help right away if:  You have a fever.  You have pain in your abdomen.  Your tongue or lips are swollen.  Your eyelids are swollen.  Your chest or throat feels tight.  You have trouble breathing or swallowing. These symptoms may represent a serious problem that is an emergency. Do not wait to see if the symptoms will go away. Get medical help right away. Call your local emergency services (911 in the [place] Do not drive yourself to the hospital. Summary  Hives (urticaria) are itchy, red, swollen areas on your skin. Hives come from the body's reaction to something a person is allergic to (allergen), something that causes irritation, or various other triggers.  Treatment for this condition depends on the cause and severity of your symptoms.  Avoid any substances that cause your hives. Keep a journal to help track what causes your hives.  Take and apply over-the-counter and prescription medicines only as told by your health care provider.  Keep all follow-up visits as told by your health care provider. This is important. This information is not intended to replace advice given to you by your health care provider. Make sure you discuss any questions you have with your health care provider. Document Released: 06/14/2005 Document Revised: 12/28/2017 Document  Reviewed: 12/28/2017 Elsevier Patient Education  2020 [employer].

## 2019-03-27 DIAGNOSIS — Z720 Tobacco use: Secondary | ICD-10-CM | POA: Insufficient documentation

## 2019-03-27 DIAGNOSIS — F17210 Nicotine dependence, cigarettes, uncomplicated: Secondary | ICD-10-CM | POA: Insufficient documentation

## 2019-03-28 ENCOUNTER — Ambulatory Visit (LOCAL_COMMUNITY_HEALTH_CENTER): Payer: Medicaid Other | Admitting: Physician Assistant

## 2019-03-28 ENCOUNTER — Other Ambulatory Visit: Payer: Self-pay

## 2019-03-28 VITALS — BP 122/82 | Ht 66.0 in | Wt 145.2 lb

## 2019-03-28 DIAGNOSIS — Z30013 Encounter for initial prescription of injectable contraceptive: Secondary | ICD-10-CM

## 2019-03-28 DIAGNOSIS — Z3042 Encounter for surveillance of injectable contraceptive: Secondary | ICD-10-CM | POA: Diagnosis not present

## 2019-03-28 DIAGNOSIS — Z3009 Encounter for other general counseling and advice on contraception: Secondary | ICD-10-CM | POA: Diagnosis not present

## 2019-03-28 MED ORDER — MEDROXYPROGESTERONE ACETATE 150 MG/ML IM SUSP
150.0000 mg | INTRAMUSCULAR | Status: AC
  Administered 2019-03-28: 11:00:00 150 mg via INTRAMUSCULAR

## 2019-03-28 NOTE — Progress Notes (Signed)
Family Planning Visit  Subjective:  Carla Hill is a 23 y.o. being seen today for an well woman visit and to discuss family planning options.    She is currently using none for pregnancy prevention. Patient reports she does not  Wants a pregnancy in the next year. Patient  has Hypertension complicating pregnancy, childbirth and puerperium with baby delivered and postnatal complication and Tobacco use on their problem list.  Chief Complaint  Patient presents with  . Contraception    restart depo (in arm)    Patient reports that she has been bleeding since her Depo is late but otherwise has not concerns.  Per chart, last RP was 10/2017, and pap and CBE are due this year.  Patient would like to restart Depo today.  Last sex was 1.5 months ago with condoms.  Patient denies any changes to personal and family history.  Declines to have pap and CBE today and states she will get in 3 months when she will schedule for RP and depo.   Does the patient desire a pregnancy in the next year? ([facility] flowsheet)  See flowsheet for other program required questions.   Body mass index is 23.44 kg/m. - Patient is eligible for diabetes screening based on BMI and age >35?  not applicable HA1C ordered? not applicable  Patient reports 1 of partners in last year. Desires STI screening?  No - .  Does the patient have a current or past history of drug use? No   No components found for: HCV]   Health Maintenance Due  Topic Date Due  . TETANUS/TDAP  04/30/2013  . PAP-Cervical Cytology Screening  10/21/2018  . INFLUENZA VACCINE  01/27/2019    Review of Systems  All other systems reviewed and are negative.   The following portions of the patient's history were reviewed and updated as appropriate: allergies, current medications, past family history, past medical history, past social history, past surgical history and problem list. Problem list updated.  Objective:   Vitals:   03/28/19 1005  BP: 122/82   Weight: 145 lb 3.2 oz (65.9 kg)  Height: 5\' 6"  (1.676 m)    Physical Exam Constitutional:      General: She is not in acute distress.    Appearance: Normal appearance.  HENT:     Head: Normocephalic and atraumatic.  Pulmonary:     Effort: Pulmonary effort is normal.  Neurological:     Mental Status: She is alert and oriented to person, place, and time.  Psychiatric:        Mood and Affect: Mood normal.        Behavior: Behavior normal.        Thought Content: Thought content normal.        Judgment: Judgment normal.       Assessment and Plan:  Carla Hill is a 23 y.o. female presenting to the [facility] for an initial well woman exam/family planning visit  Contraception counseling: Reviewed all forms of birth control options available including abstinence; over the counter/barrier methods; hormonal contraceptive medication including pill, patch, ring, injection,contraceptive implant; hormonal and nonhormonal IUDs; permanent sterilization options including vasectomy and the various tubal sterilization modalities. Risks and benefits reviewed.  Questions were answered.  Written information was also given to the patient to review.  Patient desires to restart Depo, this was prescribed for patient. She will follow up in  3 months for RP and next depo and prn for surveillance.  She was  told to call with any further questions, or with any concerns about this method of contraception.  Emphasized use of condoms 100% of the time for STI prevention.  1. Encounter for counseling regarding contraception Counseled that irregular bleeding is likely due to being late on Depo and this is common. Counseled that bleeding is likely to stop in a few days after getting depo today. Rec condoms with all sex for 2 weeks after shot today and enc always for STD protection.  2. Surveillance for Depo-Provera contraception OK to restart Depo 150mg  IM q11-13 weeks x 2 Reviewed with  patient when to call clinic for irregular bleeding. - medroxyPROGESTERone (DEPO-PROVERA) injection 150 mg     No follow-ups on file.  No future appointments.  Jerene Dilling, PA

## 2019-03-28 NOTE — Progress Notes (Signed)
Last physical at [facility] 11/04/2017. Last Depo at [facility] 11/23/2018; 17.6 weeks post depo. Per [facility] records: PAP & CBE due 2020 & in PAP flowsheet 10/23/2015 pap was NIL.

## 2019-03-28 NOTE — Progress Notes (Signed)
Provider orders completed. 

## 2019-04-07 ENCOUNTER — Emergency Department
Admission: EM | Admit: 2019-04-07 | Discharge: 2019-04-07 | Disposition: A | Discharge status: Home / Self Care | Payer: Medicaid Other | Attending: Emergency Medicine | Admitting: Emergency Medicine

## 2019-04-07 ENCOUNTER — Encounter: Payer: Self-pay | Admitting: Emergency Medicine

## 2019-04-07 ENCOUNTER — Other Ambulatory Visit: Payer: Self-pay

## 2019-04-07 DIAGNOSIS — F17210 Nicotine dependence, cigarettes, uncomplicated: Secondary | ICD-10-CM | POA: Diagnosis not present

## 2019-04-07 DIAGNOSIS — L03011 Cellulitis of right finger: Secondary | ICD-10-CM | POA: Diagnosis not present

## 2019-04-07 DIAGNOSIS — Z79899 Other long term (current) drug therapy: Secondary | ICD-10-CM | POA: Diagnosis not present

## 2019-04-07 DIAGNOSIS — M79644 Pain in right finger(s): Secondary | ICD-10-CM | POA: Diagnosis present

## 2019-04-07 MED ORDER — AMOXICILLIN-POT CLAVULANATE 875-125 MG PO TABS: 1.0000 | ORAL_TABLET | Freq: Two times a day (BID) | ORAL | 0 refills | Status: DC

## 2019-04-07 NOTE — ED Notes (Signed)
finger splint placed by this Rn per pt's request.

## 2019-04-07 NOTE — ED Triage Notes (Signed)
Here for infection around finger nail.  Pt reports had hang nail and pulled it but now swollen and painful.  No fevers. No drainage.
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
## 2019-09-10 ENCOUNTER — Encounter: Payer: Self-pay | Admitting: Family Medicine

## 2019-09-11 ENCOUNTER — Encounter: Payer: Medicaid Other | Admitting: Family Medicine

## 2019-10-03 ENCOUNTER — Encounter: Payer: Medicaid Other | Admitting: Family Medicine

## 2019-10-04 ENCOUNTER — Encounter: Payer: Medicaid Other | Admitting: Family Medicine

## 2019-10-04 DIAGNOSIS — Z20828 Contact with and (suspected) exposure to other viral communicable diseases: Secondary | ICD-10-CM | POA: Diagnosis not present

## 2019-10-09 DIAGNOSIS — Z20828 Contact with and (suspected) exposure to other viral communicable diseases: Secondary | ICD-10-CM | POA: Diagnosis not present

## 2019-10-12 ENCOUNTER — Other Ambulatory Visit: Payer: Self-pay

## 2019-10-12 ENCOUNTER — Encounter: Payer: Self-pay | Admitting: Family Medicine

## 2019-10-12 ENCOUNTER — Ambulatory Visit (INDEPENDENT_AMBULATORY_CARE_PROVIDER_SITE_OTHER): Payer: Medicaid Other | Admitting: Family Medicine

## 2019-10-12 VITALS — BP 122/84 | HR 94 | Temp 98.3°F | Ht 65.0 in | Wt 155.0 lb

## 2019-10-12 DIAGNOSIS — Z0000 Encounter for general adult medical examination without abnormal findings: Secondary | ICD-10-CM

## 2019-10-12 DIAGNOSIS — Z113 Encounter for screening for infections with a predominantly sexual mode of transmission: Secondary | ICD-10-CM

## 2019-10-12 MED ORDER — NORGESTIM-ETH ESTRAD TRIPHASIC 0.18/0.215/0.25 MG-35 MCG PO TABS: 1.0000 | ORAL_TABLET | Freq: Every day | ORAL | 4 refills | Status: DC
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
## 2019-10-13 LAB — COMPREHENSIVE METABOLIC PANEL
ALT: 7 IU/L (ref 0–32)
AST: 16 IU/L (ref 0–40)
Albumin/Globulin Ratio: 2 (ref 1.2–2.2)
Albumin: 4.3 g/dL (ref 3.9–5.0)
Alkaline Phosphatase: 75 IU/L (ref 39–117)
BUN/Creatinine Ratio: 9 (ref 9–23)
BUN: 6 mg/dL (ref 6–20)
Bilirubin Total: 0.5 mg/dL (ref 0.0–1.2)
CO2: 21 mmol/L (ref 20–29)
Calcium: 9.2 mg/dL (ref 8.7–10.2)
Chloride: 103 mmol/L (ref 96–106)
Creatinine, Ser: 0.66 mg/dL (ref 0.57–1.00)
GFR calc Af Amer: 142 mL/min/{1.73_m2} (ref >59)
GFR calc non Af Amer: 123 mL/min/{1.73_m2} (ref >59)
Globulin, Total: 2.2 g/dL (ref 1.5–4.5)
Glucose: 79 mg/dL (ref 65–99)
Potassium: 3.8 mmol/L (ref 3.5–5.2)
Sodium: 138 mmol/L (ref 134–144)
Total Protein: 6.5 g/dL (ref 6.0–8.5)

## 2019-10-13 LAB — CBC WITH DIFFERENTIAL/PLATELET
Basophils Absolute: 0 10*3/uL (ref 0.0–0.2)
Basos: 1 %
EOS (ABSOLUTE): 0.1 10*3/uL (ref 0.0–0.4)
Eos: 1 %
Hematocrit: 42.9 % (ref 34.0–46.6)
Hemoglobin: 14.6 g/dL (ref 11.1–15.9)
Immature Grans (Abs): 0 10*3/uL (ref 0.0–0.1)
Immature Granulocytes: 0 %
Lymphocytes Absolute: 1.9 10*3/uL (ref 0.7–3.1)
Lymphs: 35 %
MCH: 33 pg (ref 26.6–33.0)
MCHC: 34 g/dL (ref 31.5–35.7)
MCV: 97 fL (ref 79–97)
Monocytes Absolute: 0.6 10*3/uL (ref 0.1–0.9)
Monocytes: 11 %
Neutrophils Absolute: 2.9 10*3/uL (ref 1.4–7.0)
Neutrophils: 52 %
Platelets: 254 10*3/uL (ref 150–450)
RBC: 4.42 x10E6/uL (ref 3.77–5.28)
RDW: 11.6 % — ABNORMAL LOW (ref 11.7–15.4)
WBC: 5.6 10*3/uL (ref 3.4–10.8)

## 2019-10-13 LAB — LIPID PANEL W/O CHOL/HDL RATIO
Cholesterol, Total: 133 mg/dL (ref 100–199)
HDL: 50 mg/dL (ref >39)
LDL Chol Calc (NIH): 74 mg/dL (ref 0–99)
Triglycerides: 36 mg/dL (ref 0–149)
VLDL Cholesterol Cal: 9 mg/dL (ref 5–40)

## 2019-10-13 LAB — HSV(HERPES SIMPLEX VRS) I + II AB-IGG
HSV 1 Glycoprotein G Ab, IgG: <0.91 index (ref 0.00–0.90)
HSV 2 IgG, Type Spec: 5.83 index — ABNORMAL HIGH (ref 0.00–0.90)

## 2019-10-13 LAB — HIV ANTIBODY (ROUTINE TESTING W REFLEX): HIV Screen 4th Generation wRfx: NONREACTIVE

## 2019-10-13 LAB — TSH: TSH: 0.55 u[IU]/mL (ref 0.450–4.500)

## 2019-10-13 LAB — GC/CHLAMYDIA PROBE AMP
Chlamydia trachomatis, NAA: NEGATIVE
Neisseria Gonorrhoeae by PCR: NEGATIVE

## 2019-10-13 LAB — RPR: RPR Ser Ql: NONREACTIVE

## 2019-10-14 LAB — UA/M W/RFLX CULTURE, ROUTINE
Bilirubin, UA: NEGATIVE
Glucose, UA: NEGATIVE
Ketones, UA: NEGATIVE
Nitrite, UA: NEGATIVE
Protein,UA: NEGATIVE
Specific Gravity, UA: 1.015 (ref 1.005–1.030)
Urobilinogen, Ur: 0.2 mg/dL (ref 0.2–1.0)
pH, UA: 7.5 (ref 5.0–7.5)

## 2019-10-14 LAB — MICROSCOPIC EXAMINATION: Bacteria, UA: NONE SEEN

## 2019-10-14 LAB — URINE CULTURE, REFLEX: Organism ID, Bacteria: NO GROWTH

## 2019-10-16 ENCOUNTER — Telehealth: Payer: Self-pay | Admitting: Family Medicine

## 2019-10-16 MED ORDER — MONTELUKAST SODIUM 10 MG PO TABS: 10.0000 mg | ORAL_TABLET | Freq: Every day | ORAL | 3 refills | Status: DC

## 2019-10-16 NOTE — Telephone Encounter (Signed)
Copied from CRM [id number]. Topic: General - Inquiry >> Oct 16, 2019  8:09 AM Reggie Pile, NT wrote: Reason for CRM: Patient called in stating she would like PCP to prescribe allergy medicine, Singulair, as previously discussed in recent appointment. Patient requests this as it is not getting any better. Please advise and call when done at [phone]

## 2019-10-16 NOTE — Telephone Encounter (Signed)
Called pt, vm not set up 

## 2019-10-16 NOTE — Telephone Encounter (Signed)
Rx sent 

## 2019-10-17 NOTE — Telephone Encounter (Signed)
Called Pt, Pt understood and verbalized understanding aout RX, Pt asked if some one could go over her test results with her . Please advise.

## 2019-10-17 NOTE — Telephone Encounter (Signed)
Called and spoke with patient. Patient wanted to know if she needs to be seen because of the positive herpes virus. Explained patient that Fleet Contras asked her to watch for any cold sores and let us know if she has some but for now Fleet Contras did not mention that patient needs to be seen. Patient verbalized understanding

## 2019-10-18 NOTE — Telephone Encounter (Signed)
Patient is calling back regarding her positve herpes test. Patient was told that she only needs medication unless she has an outbreak. Patient does not believe that is true.  She is wanting preventative medication. She does not know much about herpes. She states that she has not been doing well. Please advise [phone]

## 2019-10-18 NOTE — Telephone Encounter (Signed)
Happy to send over preventative dose if she would feel more comfortable with that but it's not mandatory that she take that if she's never had an outbreak or has infrequent outbreaks. This is a very common virus to test positive for exposure to and some people never have a sore with it so we will often just monitor. As far as her not doing well, I would need a bit more elaboration on that to be able to know how to help

## 2019-10-19 ENCOUNTER — Other Ambulatory Visit: Payer: Self-pay | Admitting: Family Medicine

## 2019-10-19 MED ORDER — VALACYCLOVIR HCL 1 G PO TABS: 1000.0000 mg | ORAL_TABLET | Freq: Every day | ORAL | 3 refills | Status: DC

## 2019-10-19 NOTE — Telephone Encounter (Signed)
Spoke with pt. Pt stated she had spoken with Roosvelt Maser this morning in the office and has picked up her medication. At this time she is feeling better and will reach back out if she has and concerns going forward.

## 2019-10-22 ENCOUNTER — Encounter: Payer: Self-pay | Admitting: Certified Nurse Midwife

## 2019-10-22 ENCOUNTER — Ambulatory Visit (INDEPENDENT_AMBULATORY_CARE_PROVIDER_SITE_OTHER): Payer: Medicaid Other | Admitting: Certified Nurse Midwife

## 2019-10-22 ENCOUNTER — Other Ambulatory Visit: Payer: Self-pay

## 2019-10-22 VITALS — BP 128/95 | HR 75 | Ht 65.0 in | Wt 152.9 lb

## 2019-10-22 DIAGNOSIS — Z7689 Persons encountering health services in other specified circumstances: Secondary | ICD-10-CM

## 2019-10-22 DIAGNOSIS — R768 Other specified abnormal immunological findings in serum: Secondary | ICD-10-CM | POA: Diagnosis not present
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
## 2019-10-22 NOTE — Progress Notes (Signed)
Patient comes in today as a new patient. She was tested at her PCP for STD's. Patient would like to go over results. She was on Depo for [facility] and has switched to pills. Periods are still not regular since starting pills.

## 2019-10-22 NOTE — Patient Instructions (Signed)

## 2019-11-24 ENCOUNTER — Observation Stay
Admission: EM | Admit: 2019-11-24 | Discharge: 2019-11-25 | Disposition: A | Discharge status: Home / Self Care | Payer: Medicaid Other | Attending: Obstetrics & Gynecology | Admitting: Obstetrics & Gynecology

## 2019-11-24 ENCOUNTER — Observation Stay: Payer: Medicaid Other | Admitting: Anesthesiology

## 2019-11-24 ENCOUNTER — Encounter: Payer: Self-pay | Admitting: Emergency Medicine

## 2019-11-24 ENCOUNTER — Encounter
Admission: EM | Disposition: A | Discharge status: Home / Self Care | Payer: Self-pay | Source: Home / Self Care | Attending: Emergency Medicine

## 2019-11-24 ENCOUNTER — Other Ambulatory Visit: Payer: Self-pay

## 2019-11-24 ENCOUNTER — Emergency Department: Payer: Medicaid Other

## 2019-11-24 DIAGNOSIS — F17290 Nicotine dependence, other tobacco product, uncomplicated: Secondary | ICD-10-CM | POA: Diagnosis not present

## 2019-11-24 DIAGNOSIS — Z3A00 Weeks of gestation of pregnancy not specified: Secondary | ICD-10-CM | POA: Insufficient documentation

## 2019-11-24 DIAGNOSIS — Z20822 Contact with and (suspected) exposure to covid-19: Secondary | ICD-10-CM | POA: Diagnosis not present

## 2019-11-24 DIAGNOSIS — N939 Abnormal uterine and vaginal bleeding, unspecified: Secondary | ICD-10-CM

## 2019-11-24 DIAGNOSIS — R0789 Other chest pain: Secondary | ICD-10-CM | POA: Diagnosis not present

## 2019-11-24 DIAGNOSIS — O00109 Unspecified tubal pregnancy without intrauterine pregnancy: Secondary | ICD-10-CM

## 2019-11-24 DIAGNOSIS — Z041 Encounter for examination and observation following transport accident: Secondary | ICD-10-CM | POA: Diagnosis present

## 2019-11-24 DIAGNOSIS — O009 Unspecified ectopic pregnancy without intrauterine pregnancy: Secondary | ICD-10-CM | POA: Diagnosis not present

## 2019-11-24 DIAGNOSIS — O209 Hemorrhage in early pregnancy, unspecified: Secondary | ICD-10-CM | POA: Diagnosis not present

## 2019-11-24 DIAGNOSIS — O00102 Left tubal pregnancy without intrauterine pregnancy: Secondary | ICD-10-CM

## 2019-11-24 DIAGNOSIS — O9A211 Injury, poisoning and certain other consequences of external causes complicating pregnancy, first trimester: Secondary | ICD-10-CM | POA: Diagnosis not present

## 2019-11-24 HISTORY — DX: Herpesviral infection of urogenital system, unspecified: A60.00

## 2019-11-24 HISTORY — PX: DIAGNOSTIC LAPAROSCOPY WITH REMOVAL OF ECTOPIC PREGNANCY: SHX6449

## 2019-11-24 LAB — TYPE AND SCREEN
ABO/RH(D): O POS
Antibody Screen: NEGATIVE

## 2019-11-24 LAB — BASIC METABOLIC PANEL
Anion gap: 7 (ref 5–15)
BUN: 15 mg/dL (ref 6–20)
CO2: 25 mmol/L (ref 22–32)
Calcium: 9.6 mg/dL (ref 8.9–10.3)
Chloride: 105 mmol/L (ref 98–111)
Creatinine, Ser: 0.63 mg/dL (ref 0.44–1.00)
GFR calc Af Amer: >60 mL/min (ref >60)
GFR calc non Af Amer: >60 mL/min (ref >60)
Glucose, Bld: 94 mg/dL (ref 70–99)
Potassium: 3.9 mmol/L (ref 3.5–5.1)
Sodium: 137 mmol/L (ref 135–145)

## 2019-11-24 LAB — CBC
HCT: 40.6 % (ref 36.0–46.0)
Hemoglobin: 14.2 g/dL (ref 12.0–15.0)
MCH: 33.6 pg (ref 26.0–34.0)
MCHC: 35 g/dL (ref 30.0–36.0)
MCV: 96 fL (ref 80.0–100.0)
Platelets: 269 10*3/uL (ref 150–400)
RBC: 4.23 MIL/uL (ref 3.87–5.11)
RDW: 12.8 % (ref 11.5–15.5)
WBC: 10.1 10*3/uL (ref 4.0–10.5)
nRBC: 0 % (ref 0.0–0.2)

## 2019-11-24 LAB — SARS CORONAVIRUS 2 BY RT PCR (HOSPITAL ORDER, PERFORMED IN ~~LOC~~ HOSPITAL LAB): SARS Coronavirus 2: NEGATIVE

## 2019-11-24 LAB — ABO/RH: ABO/RH(D): O POS

## 2019-11-24 LAB — POCT PREGNANCY, URINE: Preg Test, Ur: POSITIVE — AB

## 2019-11-24 LAB — HCG, QUANTITATIVE, PREGNANCY: hCG, Beta Chain, Quant, S: 4417 m[IU]/mL — ABNORMAL HIGH (ref <5)

## 2019-11-24 LAB — POC SARS CORONAVIRUS 2 AG: SARS Coronavirus 2 Ag: NEGATIVE

## 2019-11-24 SURGERY — LAPAROSCOPY, WITH ECTOPIC PREGNANCY SURGICAL TREATMENT
Anesthesia: General

## 2019-11-24 MED ORDER — ROCURONIUM BROMIDE 10 MG/ML (PF) SYRINGE
PREFILLED_SYRINGE | INTRAVENOUS | Status: AC
  Filled 2019-11-24: qty 10

## 2019-11-24 MED ORDER — SUCCINYLCHOLINE CHLORIDE 200 MG/10ML IV SOSY
PREFILLED_SYRINGE | INTRAVENOUS | Status: AC
  Filled 2019-11-24: qty 10

## 2019-11-24 MED ORDER — FENTANYL CITRATE (PF) 100 MCG/2ML IJ SOLN
INTRAMUSCULAR | Status: DC | PRN
  Administered 2019-11-24 (×3): 50 ug via INTRAVENOUS
  Administered 2019-11-24: 100 ug via INTRAVENOUS

## 2019-11-24 MED ORDER — LABETALOL HCL 5 MG/ML IV SOLN
INTRAVENOUS | Status: AC
  Filled 2019-11-24: qty 4

## 2019-11-24 MED ORDER — BUPIVACAINE HCL (PF) 0.5 % IJ SOLN
INTRAMUSCULAR | Status: AC
  Filled 2019-11-24: qty 30

## 2019-11-24 MED ORDER — MIDAZOLAM HCL 2 MG/2ML IJ SOLN
INTRAMUSCULAR | Status: AC
  Filled 2019-11-24: qty 2

## 2019-11-24 MED ORDER — SIMETHICONE 80 MG PO CHEW: 80.0000 mg | CHEWABLE_TABLET | Freq: Four times a day (QID) | ORAL | Status: DC | PRN

## 2019-11-24 MED ORDER — ONDANSETRON HCL 4 MG/2ML IJ SOLN: 4.0000 mg | Freq: Once | INTRAMUSCULAR | Status: DC | PRN

## 2019-11-24 MED ORDER — SENNOSIDES-DOCUSATE SODIUM 8.6-50 MG PO TABS: 1.0000 | ORAL_TABLET | Freq: Every evening | ORAL | Status: DC | PRN

## 2019-11-24 MED ORDER — PROPOFOL 10 MG/ML IV BOLUS
INTRAVENOUS | Status: DC | PRN
  Administered 2019-11-24: 200 mg via INTRAVENOUS

## 2019-11-24 MED ORDER — LIDOCAINE HCL (PF) 2 % IJ SOLN
INTRAMUSCULAR | Status: AC
  Filled 2019-11-24: qty 5

## 2019-11-24 MED ORDER — DEXAMETHASONE SODIUM PHOSPHATE 10 MG/ML IJ SOLN
INTRAMUSCULAR | Status: DC | PRN
  Administered 2019-11-24: 10 mg via INTRAVENOUS

## 2019-11-24 MED ORDER — LACTATED RINGERS IV SOLN: INTRAVENOUS | Status: DC

## 2019-11-24 MED ORDER — MORPHINE SULFATE (PF) 2 MG/ML IV SOLN: 1.0000 mg | INTRAVENOUS | Status: DC | PRN

## 2019-11-24 MED ORDER — FENTANYL CITRATE (PF) 250 MCG/5ML IJ SOLN
INTRAMUSCULAR | Status: AC
  Filled 2019-11-24: qty 5

## 2019-11-24 MED ORDER — DEXAMETHASONE SODIUM PHOSPHATE 10 MG/ML IJ SOLN
INTRAMUSCULAR | Status: AC
  Filled 2019-11-24: qty 1

## 2019-11-24 MED ORDER — BUPIVACAINE HCL (PF) 0.5 % IJ SOLN
INTRAMUSCULAR | Status: DC | PRN
  Administered 2019-11-24: 8 mL

## 2019-11-24 MED ORDER — ONDANSETRON HCL 4 MG/2ML IJ SOLN
INTRAMUSCULAR | Status: AC
  Filled 2019-11-24: qty 2

## 2019-11-24 MED ORDER — BISACODYL 10 MG RE SUPP: 10.0000 mg | Freq: Every day | RECTAL | Status: DC | PRN

## 2019-11-24 MED ORDER — ONDANSETRON HCL 4 MG/2ML IJ SOLN
INTRAMUSCULAR | Status: DC | PRN
  Administered 2019-11-24: 4 mg via INTRAVENOUS

## 2019-11-24 MED ORDER — DOCUSATE SODIUM 100 MG PO CAPS
100.0000 mg | ORAL_CAPSULE | Freq: Two times a day (BID) | ORAL | Status: DC
  Administered 2019-11-25: 100 mg via ORAL
  Filled 2019-11-24: qty 1

## 2019-11-24 MED ORDER — PROPOFOL 10 MG/ML IV BOLUS
INTRAVENOUS | Status: AC
  Filled 2019-11-24: qty 20

## 2019-11-24 MED ORDER — ROCURONIUM BROMIDE 100 MG/10ML IV SOLN
INTRAVENOUS | Status: DC | PRN
  Administered 2019-11-24: 40 mg via INTRAVENOUS

## 2019-11-24 MED ORDER — ONDANSETRON HCL 4 MG PO TABS: 4.0000 mg | ORAL_TABLET | Freq: Four times a day (QID) | ORAL | Status: DC | PRN

## 2019-11-24 MED ORDER — FENTANYL CITRATE (PF) 100 MCG/2ML IJ SOLN: 25.0000 ug | INTRAMUSCULAR | Status: DC | PRN

## 2019-11-24 MED ORDER — OXYCODONE-ACETAMINOPHEN 5-325 MG PO TABS
1.0000 | ORAL_TABLET | ORAL | Status: DC | PRN
  Administered 2019-11-25: 1 via ORAL
  Filled 2019-11-24: qty 1

## 2019-11-24 MED ORDER — NEOSTIGMINE METHYLSULFATE 10 MG/10ML IV SOLN
INTRAVENOUS | Status: DC | PRN
  Administered 2019-11-24: 50 mg via INTRAVENOUS

## 2019-11-24 MED ORDER — MIDAZOLAM HCL 2 MG/2ML IJ SOLN
INTRAMUSCULAR | Status: DC | PRN
  Administered 2019-11-24: 2 mg via INTRAVENOUS

## 2019-11-24 MED ORDER — LIDOCAINE HCL (CARDIAC) PF 100 MG/5ML IV SOSY
PREFILLED_SYRINGE | INTRAVENOUS | Status: DC | PRN
  Administered 2019-11-24: 80 mg via INTRAVENOUS

## 2019-11-24 MED ORDER — LACTATED RINGERS IV SOLN
INTRAVENOUS | Status: DC | PRN
Start: 2019-11-24 — End: 2019-11-24

## 2019-11-24 MED ORDER — ACETAMINOPHEN 325 MG PO TABS: 650.0000 mg | ORAL_TABLET | ORAL | Status: DC | PRN

## 2019-11-24 MED ORDER — GLYCOPYRROLATE 0.2 MG/ML IJ SOLN
INTRAMUSCULAR | Status: DC | PRN
  Administered 2019-11-24: .6 mg via INTRAVENOUS

## 2019-11-24 MED ORDER — ONDANSETRON HCL 4 MG/2ML IJ SOLN: 4.0000 mg | Freq: Four times a day (QID) | INTRAMUSCULAR | Status: DC | PRN

## 2019-11-24 MED ORDER — LABETALOL HCL 5 MG/ML IV SOLN
INTRAVENOUS | Status: DC | PRN
Start: 2019-11-24 — End: 2019-11-24
  Administered 2019-11-24: 5 mg via INTRAVENOUS

## 2019-11-24 MED ORDER — SODIUM CHLORIDE FLUSH 0.9 % IV SOLN
INTRAVENOUS | Status: AC
  Filled 2019-11-24: qty 3

## 2019-11-24 SURGICAL SUPPLY — 36 items
BLADE SURG SZ11 CARB STEEL (BLADE) ×3 IMPLANT
CANISTER SUCT 1200ML W/VALVE (MISCELLANEOUS) ×3 IMPLANT
CATH ROBINSON RED A/P 16FR (CATHETERS) ×3 IMPLANT
CHLORAPREP W/TINT 26 (MISCELLANEOUS) ×3 IMPLANT
COVER WAND RF STERILE (DRAPES) ×3 IMPLANT
DERMABOND ADVANCED (GAUZE/BANDAGES/DRESSINGS) ×1
DERMABOND ADVANCED .7 DNX12 (GAUZE/BANDAGES/DRESSINGS) ×2 IMPLANT
DRSG TELFA 4X3 1S NADH ST (GAUZE/BANDAGES/DRESSINGS) IMPLANT
GLOVE BIO SURGEON STRL SZ8 (GLOVE) ×3 IMPLANT
GLOVE INDICATOR 8.0 STRL GRN (GLOVE) ×3 IMPLANT
GOWN STRL REUS W/ TWL LRG LVL3 (GOWN DISPOSABLE) ×2 IMPLANT
GOWN STRL REUS W/ TWL XL LVL3 (GOWN DISPOSABLE) ×2 IMPLANT
GOWN STRL REUS W/TWL LRG LVL3 (GOWN DISPOSABLE) ×1
GOWN STRL REUS W/TWL XL LVL3 (GOWN DISPOSABLE) ×1
IRRIGATION STRYKERFLOW (MISCELLANEOUS) IMPLANT
IRRIGATOR STRYKERFLOW (MISCELLANEOUS)
IV LACTATED RINGERS 1000ML (IV SOLUTION) IMPLANT
KIT PINK PAD W/HEAD ARE REST (MISCELLANEOUS) ×3
KIT PINK PAD W/HEAD ARM REST (MISCELLANEOUS) ×2 IMPLANT
LABEL OR SOLS (LABEL) ×3 IMPLANT
NEEDLE VERESS 14GA 120MM (NEEDLE) ×3 IMPLANT
NS IRRIG 500ML POUR BTL (IV SOLUTION) ×3 IMPLANT
PACK GYN LAPAROSCOPIC (MISCELLANEOUS) ×3 IMPLANT
PAD PREP 24X41 OB/GYN DISP (PERSONAL CARE ITEMS) ×3 IMPLANT
POUCH SPECIMEN RETRIEVAL 10MM (ENDOMECHANICALS) IMPLANT
SCISSORS METZENBAUM CVD 33 (INSTRUMENTS) ×3 IMPLANT
SET TUBE SMOKE EVAC HIGH FLOW (TUBING) ×3 IMPLANT
SHEARS HARMONIC ACE PLUS 36CM (ENDOMECHANICALS) IMPLANT
SLEEVE ENDOPATH XCEL 5M (ENDOMECHANICALS) IMPLANT
SPONGE GAUZE 2X2 8PLY STRL LF (GAUZE/BANDAGES/DRESSINGS) IMPLANT
STRAP SAFETY 5IN WIDE (MISCELLANEOUS) ×3 IMPLANT
SUT VIC AB 2-0 UR6 27 (SUTURE) IMPLANT
SUT VIC AB 4-0 PS2 18 (SUTURE) IMPLANT
SYR 10ML LL (SYRINGE) ×3 IMPLANT
TROCAR ENDO BLADELESS 11MM (ENDOMECHANICALS) IMPLANT
TROCAR XCEL NON-BLD 5MMX100MML (ENDOMECHANICALS) ×3 IMPLANT

## 2019-11-24 NOTE — Op Note (Signed)
Carla Hill PROCEDURE DATE: 11/24/2019  PREOPERATIVE DIAGNOSIS: Ruptured ectopic pregnancy POSTOPERATIVE DIAGNOSIS:  left fallopian tube ectopic pregnancy PROCEDURE: Laparoscopic left salpingostomy and removal of ectopic pregnancy SURGEON:  Dr. Annamarie Major ANESTHESIOLOGIST: Naomie Dean, MD  ANES: General  INDICATIONS: 24 y.o. G9M2111  here with the preoperative diagnoses as listed above.  Please refer to preoperative notes for more details. Patient was counseled regarding need for laparoscopic salpingostomy. Risks of surgery including bleeding which may require transfusion or reoperation, infection, injury to bowel or other surrounding organs, need for additional procedures including laparotomy and other postoperative/anesthesia complications were explained to patient.  Written informed consent was obtained.  FINDINGS:  No hemoperitoneum.   Dilated left fallopian tube containing ectopic gestation. Small normal appearing uterus, normal right fallopian tube, right ovary and left ovary.  ANESTHESIA: General ESTIMATED BLOOD LOSS: 30 ml URINE OUTPUT: 100 ml SPECIMENS: Left fallopian tube contents containing ectopic gestation COMPLICATIONS: None immediate  PROCEDURE IN DETAIL:  The patient was taken to the operating room where general anesthesia was administered and was found to be adequate.  She was placed in the dorsal lithotomy position, and was prepped and draped in a sterile manner.  A Foley catheter was inserted into her bladder and attached to constant drainage and a sponge stick was placed vaginally for any manipulation purposes .   After an adequate timeout was performed, attention was turned to the abdomen where an umbilical incision was made with the scalpel.  Veress needle is placed with confirmation using the hanging drop technique.   The abdomen was then insufflated with carbon dioxide gas and adequate pneumoperitoneum was obtained.  The 5-mm trocar and sleeve were then  advanced without difficulty with the laparoscope under direct visualization into the abdomen.   A survey of the patient's pelvis and abdomen revealed the findings above.  A 5-mm right lower quadrant port was then placed under direct visualization. The suction irrigator was then used to suction and irrigate the pelvis.  Attention was then turned to the left fallopian tube which was incised and dissected using the Harmonic instrument. A linear salpingostomy incision is made over the central portion of the dilated ampulary portion of the tube.  Tubal contents removed with a grasping forceps.  Good hemostasis was noted.  The abdomen was desufflated, and all instruments were removed.  All skin incisions were closed Dermabond. The patient tolerated the procedure well.  All instruments, needles, and sponge counts were correct x 2. The patient was taken to the recovery room in stable condition.   The patient will be discharged to home as per PACU criteria.  Routine postoperative instructions given.   Annamarie Major, MD, [facility], [facility] 11/24/2019  10:33 PM

## 2019-11-24 NOTE — Transfer of Care (Signed)
Immediate Anesthesia Transfer of Care Note  Patient: Carla Hill  Procedure(s) Performed: DIAGNOSTIC LAPAROSCOPY WITH REMOVAL OF ECTOPIC PREGNANCY, Left Salpingostomy (Left )  Patient Location: PACU  Anesthesia Type:General  Level of Consciousness: awake  Airway & Oxygen Therapy: Patient Spontanous Breathing and Patient connected to nasal cannula oxygen  Post-op Assessment: Report given to RN  Post vital signs: Reviewed and stable  Last Vitals:  Vitals Value Taken Time  BP 143/101 11/24/19 2240  Temp    Pulse 58 11/24/19 2240  Resp 19 11/24/19 2240  SpO2 99 % 11/24/19 2240  Vitals shown include unvalidated device data.  Last Pain:  Vitals:   11/24/19 2034  TempSrc: Oral  PainSc:          Complications: No apparent anesthesia complications

## 2019-11-24 NOTE — Anesthesia Preprocedure Evaluation (Signed)
Anesthesia Evaluation  Patient identified by MRN, date of birth, ID band Patient awake    Reviewed: Allergy & Precautions, NPO status , Patient's Chart, lab work & pertinent test results  History of Anesthesia Complications Negative for: history of anesthetic complications  Airway Mallampati: II       Dental   Pulmonary neg sleep apnea, COPD,  COPD inhaler, Current Smoker,           Cardiovascular (-) hypertension(-) Past MI and (-) CHF (-) dysrhythmias (-) Valvular Problems/Murmurs     Neuro/Psych neg Seizures    GI/Hepatic Neg liver ROS, neg GERD  ,  Endo/Other  neg diabetes  Renal/GU negative Renal ROS     Musculoskeletal   Abdominal   Peds  Hematology   Anesthesia Other Findings   Reproductive/Obstetrics                             Anesthesia Physical Anesthesia Plan  ASA: II and emergent  Anesthesia Plan: General   Post-op Pain Management:    Induction: Intravenous  PONV Risk Score and Plan: 2 and Ondansetron and Dexamethasone  Airway Management Planned: Oral ETT  Additional Equipment:   Intra-op Plan:   Post-operative Plan:   Informed Consent: I have reviewed the patients History and Physical, chart, labs and discussed the procedure including the risks, benefits and alternatives for the proposed anesthesia with the patient or authorized representative who has indicated his/her understanding and acceptance.       Plan Discussed with:   Anesthesia Plan Comments:         Anesthesia Quick Evaluation
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
## 2019-11-24 NOTE — Anesthesia Procedure Notes (Signed)
Procedure Name: Intubation Date/Time: 11/24/2019 9:44 PM Performed by: Orion Crook, CRNA Pre-anesthesia Checklist: Patient identified, Emergency Drugs available, Suction available and Patient being monitored Patient Re-evaluated:Patient Re-evaluated prior to induction Preoxygenation: Pre-oxygenation with 100% oxygen Induction Type: IV induction Ventilation: Mask ventilation without difficulty Laryngoscope Size: Mac and 3 Grade View: Grade II Tube type: Oral Tube size: 7.0 mm Placement Confirmation: ETT inserted through vocal cords under direct vision,  positive ETCO2 and breath sounds checked- equal and bilateral Secured at: 22 cm Tube secured with: Tape Dental Injury: Teeth and Oropharynx as per pre-operative assessment

## 2019-11-24 NOTE — ED Provider Notes (Signed)
EKG is reviewed inter by 1810 Heart rate 79 QRS 89 QTc 430 Normal sinus rhythm, early repolarization.  No acute ischemia   Sharyn Creamer, MD 11/24/19 [phone]

## 2019-11-24 NOTE — ED Notes (Addendum)
POC urine pregnancy positive at this time. EDP notified.

## 2019-11-24 NOTE — ED Notes (Signed)
Report given to [facility] in PACU
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
## 2019-11-25 LAB — HEMOGLOBIN: Hemoglobin: 13.6 g/dL (ref 12.0–15.0)

## 2019-11-25 MED ORDER — KETOROLAC TROMETHAMINE 30 MG/ML IJ SOLN
30.0000 mg | Freq: Four times a day (QID) | INTRAMUSCULAR | Status: DC
  Administered 2019-11-25: 30 mg via INTRAVENOUS
  Filled 2019-11-25: qty 1

## 2019-11-25 MED ORDER — OXYCODONE-ACETAMINOPHEN 5-325 MG PO TABS: 1.0000 | ORAL_TABLET | ORAL | 0 refills | Status: DC | PRN

## 2019-11-25 NOTE — Plan of Care (Signed)
Sleeping at Intervals but easily awakened.  Oriented X3 with pleasant affect.Tolerating Clear Liquids and jokingly asked for "Cheeseburger." Assessment and VSS. Color good. Skin W&D. BBS clear. 2 Surgical incisions on abdomen with 2X2 dressings clean, dry and intact. Oriented to Room, Instructed in Fall Prevention and POC. Pt. V/O.

## 2019-11-25 NOTE — Anesthesia Postprocedure Evaluation (Signed)
Anesthesia Post Note  Patient: Carla Hill  Procedure(s) Performed: DIAGNOSTIC LAPAROSCOPY WITH REMOVAL OF ECTOPIC PREGNANCY, Left Salpingostomy (Left )  Patient location during evaluation: PACU Anesthesia Type: General Level of consciousness: awake and alert Pain management: pain level controlled Vital Signs Assessment: post-procedure vital signs reviewed and stable Respiratory status: spontaneous breathing and respiratory function stable Cardiovascular status: stable Anesthetic complications: no     Last Vitals:  Vitals:   11/25/19 0424 11/25/19 0750  BP: 105/77 112/69  Pulse: 87 77  Resp: 18 18  Temp: 36.6 C 36.6 C  SpO2: 98% 100%    Last Pain:  Vitals:   11/25/19 0750  TempSrc: Oral  PainSc:                  Ethelbert Thain K

## 2019-11-25 NOTE — Progress Notes (Signed)
Discharge instructions complete and prescriptions sent to the pharmacy by the provider. Patient verbalizes understanding of teaching. Patient discharged home via wheelchair at 1010.

## 2019-11-25 NOTE — Discharge Instructions (Signed)
Ectopic Pregnancy  An ectopic pregnancy is when the fertilized egg attaches (implants) outside the uterus. Most ectopic pregnancies occur in one of the tubes where eggs travel from the ovary to the uterus (fallopian tubes), but the implanting can occur in other locations. In rare cases, ectopic pregnancies occur on the ovary, intestine, pelvis, abdomen, or cervix. In an ectopic pregnancy, the fertilized egg does not have the ability to develop into a normal, healthy baby. A ruptured ectopic pregnancy is one in which tearing or bursting of a fallopian tube causes internal bleeding. Often, there is intense lower abdominal pain, and vaginal bleeding sometimes occurs. Having an ectopic pregnancy can be life-threatening. If this dangerous condition is not treated, it can lead to blood loss, shock, or even death. What are the causes? The most common cause of this condition is damage to one of the fallopian tubes. A fallopian tube may be narrowed or blocked, and that keeps the fertilized egg from reaching the uterus. What increases the risk? This condition is more likely to develop in women of childbearing age who have different levels of risk. The levels of risk can be divided into three categories. High risk  You have gone through infertility treatment.  You have had an ectopic pregnancy before.  You have had surgery on the fallopian tubes, or another surgical procedure, such as an abortion.  You have had surgery to have the fallopian tubes tied (tubal ligation).  You have problems or diseases of the fallopian tubes.  You have been exposed to diethylstilbestrol (DES). This medicine was used until 1971, and it had effects on babies whose mothers took the medicine.  You become pregnant while using an IUD (intrauterine device) for birth control. Moderate risk  You have a history of infertility.  You have had an STI (sexually transmitted infection).  You have a history of pelvic inflammatory  disease (PID).  You have scarring from endometriosis.  You have multiple sexual partners.  You smoke. Low risk  You have had pelvic surgery.  You use vaginal douches.  You became sexually active before age 65. What are the signs or symptoms? Common symptoms of this condition include normal pregnancy symptoms, such as missing a period, nausea, tiredness, abdominal pain, breast tenderness, and bleeding. However, ectopic pregnancy will have additional symptoms, such as:  Pain with intercourse.  Irregular vaginal bleeding or spotting.  Cramping or pain on one side or in the lower abdomen.  Fast heartbeat, low blood pressure, and sweating.  Passing out while having a bowel movement. Symptoms of a ruptured ectopic pregnancy and internal bleeding may include:  Sudden, severe pain in the abdomen and pelvis.  Dizziness, weakness, light-headedness, or fainting.  Pain in the shoulder or neck area. How is this diagnosed? This condition is diagnosed by:  A pelvic exam to locate pain or a mass in the abdomen.  A pregnancy test. This blood test checks for the presence as well as the specific level of pregnancy hormone in the bloodstream.  Ultrasound. This is performed if a pregnancy test is positive. In this test, a probe is inserted into the vagina. The probe will detect a fetus, possibly in a location other than the uterus.  Taking a sample of uterus tissue (dilation and curettage, or D&C).  Surgery to perform a visual exam of the inside of the abdomen using a thin, lighted tube that has a tiny camera on the end (laparoscope).  Culdocentesis. This procedure involves inserting a needle at the top  of the vagina, behind the uterus. If blood is present in this area, it may indicate that a fallopian tube is torn. How is this treated? This condition is treated with medicine or surgery. Medicine  An injection of a medicine (methotrexate) may be given to cause the pregnancy tissue to be  absorbed. This medicine may save your fallopian tube. It may be given if: ? The diagnosis is made early, with no signs of active bleeding. ? The fallopian tube has not ruptured. ? You are considered to be a good candidate for the medicine. Usually, pregnancy hormone blood levels are checked after methotrexate treatment. This is to be sure that the medicine is effective. It may take 4-6 weeks for the pregnancy to be absorbed. Most pregnancies will be absorbed by 3 weeks. Surgery  A laparoscope may be used to remove the pregnancy tissue.  If severe internal bleeding occurs, a larger cut (incision) may be made in the lower abdomen (laparotomy) to remove the fetus and placenta. This is done to stop the bleeding.  Part or all of the fallopian tube may be removed (salpingectomy) along with the fetus and placenta. The fallopian tube may also be repaired during the surgery.  In very rare circumstances, removal of the uterus (hysterectomy) may be required.  After surgery, pregnancy hormone testing may be done to be sure that there is no pregnancy tissue left. Whether your treatment is medicine or surgery, you may receive a Rho (D) immune globulin shot to prevent problems with any future pregnancy. This shot may be given if:  You are Rh-negative and the baby's father is Rh-positive.  You are Rh-negative and you do not know the Rh type of the baby's father. Follow these instructions at home:  Rest and limit your activity after the procedure for as long as told by your health care provider.  Until your health care provider says that it is safe: ? Do not lift anything that is heavier than 10 lb (4.5 kg), or the limit that your health care provider tells you. ? Avoid physical exercise and any movement that requires effort (is strenuous).  To help prevent constipation: ? Eat a healthy diet that includes fruits, vegetables, and whole grains. ? Drink 6-8 glasses of water per day. Get help right away  if:  You develop worsening pain that is not relieved by medicine.  You have: ? A fever or chills. ? Vaginal bleeding. ? Redness and swelling at the incision site. ? Nausea and vomiting.  You feel dizzy or weak.  You feel light-headed or you faint. This information is not intended to replace advice given to you by your health care provider. Make sure you discuss any questions you have with your health care provider. Document Revised: 05/27/2017 Document Reviewed: 01/14/2016 Elsevier Patient Education  2020 [employer] Diagnostic Laparoscopy, Care After This sheet gives you information about how to care for yourself after your procedure. Your health care provider may also give you more specific instructions. If you have problems or questions, contact your health care provider. What can I expect after the procedure? After the procedure, it is common to have:  Mild discomfort in the abdomen.  Sore throat. Women who have laparoscopy with pelvic examination may have mild cramping and fluid coming from the vagina for a few days after the procedure. Follow these instructions at home: Medicines  Take over-the-counter and prescription medicines only as told by your health care provider.  If you were prescribed an antibiotic medicine,  take it as told by your health care provider. Do not stop taking the antibiotic even if you start to feel better. Driving  Do not drive for 24 hours if you were given a medicine to help you relax (sedative) during your procedure.  Do not drive or use heavy machinery while taking prescription pain medicine. Bathing  Do not take baths, swim, or use a hot tub until your health care provider approves. You may take showers. Incision care   Follow instructions from your health care provider about how to take care of your incisions. Make sure you: ? Wash your hands with soap and water before you change your bandage (dressing). If soap and water are not  available, use hand sanitizer. ? Change your dressing as told by your health care provider. ? Leave stitches (sutures), skin glue, or adhesive strips in place. These skin closures may need to stay in place for 2 weeks or longer. If adhesive strip edges start to loosen and curl up, you may trim the loose edges. Do not remove adhesive strips completely unless your health care provider tells you to do that.  Check your incision areas every day for signs of infection. Check for: ? Redness, swelling, or pain. ? Fluid or blood. ? Warmth. ? Pus or a bad smell. Activity  Return to your normal activities as told by your health care provider. Ask your health care provider what activities are safe for you.  Do not lift anything that is heavier than 10 lb (4.5 kg), or the limit that you are told, until your health care provider says that it is safe. General instructions  To prevent or treat constipation while you are taking prescription pain medicine, your health care provider may recommend that you: ? Drink enough fluid to keep your urine pale yellow. ? Take over-the-counter or prescription medicines. ? Eat foods that are high in fiber, such as fresh fruits and vegetables, whole grains, and beans. ? Limit foods that are high in fat and processed sugars, such as fried and sweet foods.  Do not use any products that contain nicotine or tobacco, such as cigarettes and e-cigarettes. If you need help quitting, ask your health care provider.  Keep all follow-up visits as told by your health care provider. This is important. Contact a health care provider if:  You develop shoulder pain.  You feel lightheaded or faint.  You are unable to pass gas or have a bowel movement.  You feel nauseous or you vomit.  You develop a rash.  You have redness, swelling, or pain around any incision.  You have fluid or blood coming from any incision.  Any incision feels warm to the touch.  You have pus or a bad  smell coming from any incision.  You have a fever or chills. Get help right away if:  You have severe pain.  You have vomiting that does not go away.  You have heavy bleeding from the vagina.  Any incision opens.  You have trouble breathing.  You have chest pain. Summary  After the procedure, it is common to have mild discomfort in the abdomen and a sore throat.  Check your incision areas every day for signs of infection.  Return to your normal activities as told by your health care provider. Ask your health care provider what activities are safe for you. This information is not intended to replace advice given to you by your health care provider. Make sure you discuss any questions  you have with your health care provider. Document Revised: 05/27/2017 Document Reviewed: 12/08/2016 Elsevier Patient Education  2020 [employer].

## 2019-11-25 NOTE — Discharge Summary (Signed)
Gynecology Physician Postoperative Discharge Summary  Patient ID: Carla Hill MRN: [id number] DOB/AGE: [birth year] 24 y.o.  Admit Date: 11/24/2019 Discharge Date: 11/25/2019  Preoperative Diagnoses: Left Ectopic Pregnancy  Procedures: Procedure(s) (LRB): DIAGNOSTIC LAPAROSCOPY WITH REMOVAL OF ECTOPIC PREGNANCY, Left Salpingostomy (Left)  Significant Labs: CBC Latest Ref Rng & Units 11/25/2019 11/24/2019 10/12/2019  WBC 4.0 - 10.5 K/uL - 10.1 5.6  Hemoglobin 12.0 - 15.0 g/dL 13.6 14.2 14.6  Hematocrit 36.0 - 46.0 % - 40.6 42.9  Platelets 150 - 400 K/uL - 269 254    Hospital Course:  Carla Hill is a 24 y.o. G1P1001  admitted for scheduled surgery.  She underwent the procedures as mentioned above, her operation was uncomplicated. For further details about surgery, please refer to the operative report. Patient had an uncomplicated postoperative course. By time of discharge on POD#1, her pain was controlled on oral pain medications; she was ambulating, voiding without difficulty, tolerating regular diet and passing flatus. She was deemed stable for discharge to home.   Discharge Exam: Blood pressure 112/69, pulse 77, temperature 97.9 F (36.6 C), temperature source Oral, resp. rate 18, height 5\' 6"  (1.676 m), weight 65.8 kg, last menstrual period 11/18/2019, SpO2 100 %. General appearance: alert and no distress  Resp: clear to auscultation bilaterally  Cardio: regular rate and rhythm  GI: soft, non-tender; bowel sounds normal; no masses, no organomegaly.  Incision: C/D/I, no erythema, no drainage noted Pelvic: scant blood on pad  Extremities: extremities normal, atraumatic, no cyanosis or edema and Homans sign is negative, no sign of DVT  Discharged Condition: Stable  Disposition: Discharge disposition: 01-Home or Self Care       Discharge Instructions    Call MD for:  persistant nausea and vomiting   Complete by: As directed    Call MD for:  redness, tenderness, or signs of  infection (pain, swelling, redness, odor or green/yellow discharge around incision site)   Complete by: As directed    Call MD for:  severe uncontrolled pain   Complete by: As directed    Call MD for:  temperature >100.4   Complete by: As directed    Change dressing (specify)   Complete by: As directed    Dressing change: remove any dressings tomorrow   Diet general   Complete by: As directed    Discharge instructions   Complete by: As directed    Resume activities according to discharge instruction sheets   Increase activity slowly   Complete by: As directed      Allergies as of 11/25/2019      Reactions   Other Swelling   Honey, pineapple, and orange juice. Mouth break out      Medication List    TAKE these medications   montelukast 10 MG tablet Commonly known as: SINGULAIR Take 1 tablet (10 mg total) by mouth at bedtime.   Norgestimate-Ethinyl Estradiol Triphasic 0.18/0.215/0.25 MG-35 MCG tablet Commonly known as: Ortho Tri-Cyclen (28) Take 1 tablet by mouth daily.   oxyCODONE-acetaminophen 5-325 MG tablet Commonly known as: PERCOCET/ROXICET Take 1-2 tablets by mouth every 4 (four) hours as needed for moderate pain.   valACYclovir 1000 MG tablet Commonly known as: VALTREX Take 1 tablet (1,000 mg total) by mouth daily.   WOMENS MULTIVITAMIN PLUS PO Take 1 tablet by mouth 1 day or 1 dose.            Discharge Care Instructions  (From admission, onward)         Start  Ordered   11/25/19 0000  Change dressing (specify)    Comments: Dressing change: remove any dressings tomorrow   11/25/19 0937         Follow-up Information    Nadara Mustard, MD. Schedule an appointment as soon as possible for a visit in 1 week(s).   Specialty: Obstetrics and Gynecology Contact information: [address] [place] [address] [phone]           Annamarie Major, MD

## 2019-11-27 ENCOUNTER — Encounter: Payer: Self-pay | Admitting: Obstetrics & Gynecology

## 2019-11-27 ENCOUNTER — Ambulatory Visit (INDEPENDENT_AMBULATORY_CARE_PROVIDER_SITE_OTHER): Payer: Medicaid Other | Admitting: Obstetrics & Gynecology

## 2019-11-27 ENCOUNTER — Other Ambulatory Visit: Payer: Self-pay

## 2019-11-27 VITALS — BP 120/80 | Ht 66.0 in | Wt 153.0 lb

## 2019-11-27 DIAGNOSIS — O00102 Left tubal pregnancy without intrauterine pregnancy: Secondary | ICD-10-CM

## 2019-11-27 NOTE — Progress Notes (Signed)
°  Postoperative Follow-up Patient presents post op from left salpingostomy for left ectopic pregnancy,  3 days ago ago.  Subjective: Patient reports marked improvement in her preop symptoms. Eating a regular diet without difficulty. The patient is not having any pain.  Activity: normal activities of daily living. Patient reports additional symptom's since surgery of No bleeding.  Objective: BP 120/80    Ht 5\' 6"  (1.676 m)    Wt 153 lb (69.4 kg)    LMP 11/18/2019 (Exact Date)    BMI 24.69 kg/m  Physical Exam Constitutional:      General: She is not in acute distress.    Appearance: She is well-developed.  Cardiovascular:     Rate and Rhythm: Normal rate.  Pulmonary:     Effort: Pulmonary effort is normal.  Abdominal:     General: There is no distension.     Palpations: Abdomen is soft.     Tenderness: There is no abdominal tenderness.     Comments: Incision Healing Well   Musculoskeletal:        General: Normal range of motion.  Neurological:     Mental Status: She is alert and oriented to person, place, and time.     Cranial Nerves: No cranial nerve deficit.  Skin:    General: Skin is warm and dry.     Assessment: s/p :  left salpingostomy  progressing well  Plan: Patient has done well after surgery with no apparent complications.  I have discussed the post-operative course to date, and the expected progress moving forward.  The patient understands what complications to be concerned about.  I will see the patient in routine follow up, or sooner if needed.    Activity plan: No restriction. Plans OCPs, will restart soon.    All options d/w pt. OCPs The risks /benefits of OCPs have been explained to the patient in detail.  Product literature has been given to her.  I have instructed her in the use of OCPs and have given her literature reinforcing this information.  I have explained to the patient that OCPs are not as effective for birth control during the first month of use,  and that another form of contraception should be used during this time.  Both first-day start and Sunday start have been explained.  The risks and benefits of each was discussed.  She has been made aware of  the fact that other medications may affect the efficacy of OCPs.  I have answered all of her questions, and I believe that she has an understanding of the effectiveness and use of OCPs.  Sunday 11/27/2019, 1:32 PM

## 2019-11-28 LAB — SURGICAL PATHOLOGY

## 2020-02-06 ENCOUNTER — Telehealth: Payer: Self-pay | Admitting: Certified Nurse Midwife

## 2020-02-06 NOTE — Telephone Encounter (Signed)
Called pt to move her appt. The pt didn't have a VM set up. Moved appt pt has active my chart

## 2020-02-14 ENCOUNTER — Encounter: Payer: Self-pay | Admitting: Family Medicine

## 2020-02-19 DIAGNOSIS — Z20822 Contact with and (suspected) exposure to covid-19: Secondary | ICD-10-CM | POA: Diagnosis not present

## 2020-02-25 ENCOUNTER — Telehealth (INDEPENDENT_AMBULATORY_CARE_PROVIDER_SITE_OTHER): Payer: Medicaid Other | Admitting: Family Medicine

## 2020-02-25 ENCOUNTER — Encounter: Payer: Self-pay | Admitting: Family Medicine

## 2020-02-25 DIAGNOSIS — Z20822 Contact with and (suspected) exposure to covid-19: Secondary | ICD-10-CM

## 2020-02-25 NOTE — Progress Notes (Signed)
LMP 02/11/2020 (Within Weeks)    Subjective:    Patient ID: Carla Hill, female    DOB: [birth year], 24 y.o.   MRN: [id number]  HPI: Carla Hill is a 24 y.o. female  Chief Complaint  Patient presents with  . covid negative    needs note to return to work, do we want her to be tested through [facility]?    Had an exposure to COVID on 7 days ago at work. She notes that the person was likely sick for days before, but her last exposure was Monday 8/23. She went to get tested 1-2 days after exposure at [facility] and thankfully she was negative. She has been out of work, but needs a note to return to work. She has been feeling well. No symptoms. No cough, no SOB, No rhinorrhea, no diarrhea. She is otherwise feeling well with no other concerns or complaints at this time.   Relevant past medical, surgical, family and social history reviewed and updated as indicated. Interim medical history since our last visit reviewed. Allergies and medications reviewed and updated.  Review of Systems  Constitutional: Negative.   HENT: Negative.   Respiratory: Negative.   Cardiovascular: Negative.   Neurological: Negative.   Psychiatric/Behavioral: Negative.     Per HPI unless specifically indicated above     Objective:    LMP 02/11/2020 (Within Weeks)   Wt Readings from Last 3 Encounters:  11/27/19 153 lb (69.4 kg)  11/24/19 145 lb (65.8 kg)  10/22/19 152 lb 14.4 oz (69.4 kg)    Physical Exam Vitals and nursing note reviewed.  Constitutional:      General: She is not in acute distress.    Appearance: Normal appearance. She is not ill-appearing, toxic-appearing or diaphoretic.  HENT:     Head: Normocephalic and atraumatic.     Right Ear: External ear normal.     Left Ear: External ear normal.     Nose: Nose normal.     Mouth/Throat:     Mouth: Mucous membranes are moist.     Pharynx: Oropharynx is clear.  Eyes:     General: No scleral icterus.       Right eye: No discharge.        Left eye:  No discharge.     Conjunctiva/sclera: Conjunctivae normal.     Pupils: Pupils are equal, round, and reactive to light.  Pulmonary:     Effort: Pulmonary effort is normal. No respiratory distress.     Comments: Speaking in full sentences Musculoskeletal:        General: Normal range of motion.     Cervical back: Normal range of motion.  Skin:    Coloration: Skin is not jaundiced or pale.     Findings: No bruising, erythema, lesion or rash.  Neurological:     Mental Status: She is alert and oriented to person, place, and time. Mental status is at baseline.  Psychiatric:        Mood and Affect: Mood normal.        Behavior: Behavior normal.        Thought Content: Thought content normal.        Judgment: Judgment normal.     Results for orders placed or performed during the hospital encounter of 11/24/19  SARS Coronavirus 2 by RT PCR (hospital order, performed in [facility] hospital lab) Nasopharyngeal Nasopharyngeal Swab   Specimen: Nasopharyngeal Swab  Result Value Ref Range   SARS Coronavirus 2 NEGATIVE NEGATIVE  CBC  Result Value Ref Range   WBC 10.1 4.0 - 10.5 K/uL   RBC 4.23 3.87 - 5.11 MIL/uL   Hemoglobin 14.2 12.0 - 15.0 g/dL   HCT 94.7 36 - 46 %   MCV 96.0 80.0 - 100.0 fL   MCH 33.6 26.0 - 34.0 pg   MCHC 35.0 30.0 - 36.0 g/dL   RDW 09.6 28.3 - 66.2 %   Platelets 269 150 - 400 K/uL   nRBC 0.0 0.0 - 0.2 %  Basic metabolic panel  Result Value Ref Range   Sodium 137 135 - 145 mmol/L   Potassium 3.9 3.5 - 5.1 mmol/L   Chloride 105 98 - 111 mmol/L   CO2 25 22 - 32 mmol/L   Glucose, Bld 94 70 - 99 mg/dL   BUN 15 6 - 20 mg/dL   Creatinine, Ser 9.47 0.44 - 1.00 mg/dL   Calcium 9.6 8.9 - 65.4 mg/dL   GFR calc non Af Amer >60 >60 mL/min   GFR calc Af Amer >60 >60 mL/min   Anion gap 7 5 - 15  hCG, quantitative, pregnancy  Result Value Ref Range   hCG, Beta Chain, Quant, S 4,417 (H) <5 mIU/mL  Hemoglobin  Result Value Ref Range   Hemoglobin 13.6 12.0 - 15.0 g/dL    Pregnancy, urine POC  Result Value Ref Range   Preg Test, Ur POSITIVE (A) NEGATIVE  POC SARS Coronavirus 2 Ag  Result Value Ref Range   SARS Coronavirus 2 Ag NEGATIVE NEGATIVE  ABO/Rh  Result Value Ref Range   ABO/RH(D)      O POS Performed at [facility], [address] [place], [place] [address]   Type and screen [facility]  Result Value Ref Range   ABO/RH(D) O POS    Antibody Screen NEG    Sample Expiration      11/27/2019,2359 Performed at [facility], [address] [place], [place] [address]   Surgical pathology  Result Value Ref Range   SURGICAL PATHOLOGY      SURGICAL PATHOLOGY CASE: [id number] PATIENT: Rhett Bannister Surgical Pathology Report     Specimen Submitted: A. Products of tube, left salpingostomy  Clinical History: Ectopic pregnancy      DIAGNOSIS: A.  PRODUCTS OF CONCEPTION; LEFT SALPINGOSTOMY: - CHORIONIC VILLI AND BLOOD CLOT, CONSISTENT WITH ECTOPIC PRODUCTS OF CONCEPTION.  GROSS DESCRIPTION: A. Labeled: Salpingostomy products of tube (left) Received: In formalin Tissue fragment(s): Multiple Size: 2.5 x 0.8 x 0.3 cm Description: Aggregate of pink-red soft tissue and hemorrhagic material Entirely submitted in 1 cassette.   Final Diagnosis performed by Elijah Birk, MD.   Electronically signed 11/28/2019 9:39:42AM The electronic signature indicates that the named Attending Pathologist has evaluated the specimen Technical component performed at [facility], [address], [place], [place] [address] Lab: [phone] Dir: Jolene Schimke, MD, MMM  Professional component performed at [facility], [address] [place], [place], [place] [address] Lab: [phone] Dir: Georgiann Cocker. Oneita Kras, MD       Assessment & Plan:   Problem List Items Addressed This Visit    None    Visit Diagnoses    Exposure to COVID-19 virus    -  Primary   Tested too soon after exposure. Will  retest. If negative, OK to return to work after negative test.    Relevant Orders   Novel Coronavirus, NAA ([facility])       Follow up plan: Return if symptoms worsen or  fail to improve.   . This visit was completed via MyChart due to the restrictions of the COVID-19 pandemic. All issues as above were discussed and addressed. Physical exam was done as above through visual confirmation on MyChart. If it was felt that the patient should be evaluated in the office, they were directed there. The patient verbally consented to this visit. . Location of the patient: home . Location of the provider: work . Those involved with this call:  . Provider: Olevia Perches, DO . CMA: Floydene Flock, RMA . Front Desk/Registration: Adela Ports  . Time spent on call: 15 minutes with patient face to face via video conference. More than 50% of this time was spent in counseling and coordination of care. 23 minutes total spent in review of patient's record and preparation of their chart.

## 2020-02-29 ENCOUNTER — Other Ambulatory Visit: Payer: Self-pay

## 2020-05-07 DIAGNOSIS — Z20822 Contact with and (suspected) exposure to covid-19: Secondary | ICD-10-CM | POA: Diagnosis not present

## 2020-05-26 ENCOUNTER — Encounter: Payer: Medicaid Other | Admitting: Certified Nurse Midwife

## 2020-05-29 ENCOUNTER — Encounter: Payer: Medicaid Other | Admitting: Certified Nurse Midwife

## 2020-06-11 DIAGNOSIS — Z20822 Contact with and (suspected) exposure to covid-19: Secondary | ICD-10-CM | POA: Diagnosis not present

## 2020-06-26 ENCOUNTER — Telehealth: Payer: Self-pay

## 2020-06-26 NOTE — Telephone Encounter (Signed)
Called pt lmtrc, Pt no showed appt on 12/2 informed pt about no show policy and asked if pt wants to reschedule to call the office

## 2020-08-22 ENCOUNTER — Other Ambulatory Visit: Payer: Self-pay

## 2020-08-22 ENCOUNTER — Ambulatory Visit (INDEPENDENT_AMBULATORY_CARE_PROVIDER_SITE_OTHER): Payer: Medicaid Other | Admitting: Advanced Practice Midwife

## 2020-08-22 ENCOUNTER — Encounter: Payer: Self-pay | Admitting: Advanced Practice Midwife

## 2020-08-22 VITALS — BP 120/80 | Ht 66.0 in | Wt 158.0 lb

## 2020-08-22 DIAGNOSIS — Z30013 Encounter for initial prescription of injectable contraceptive: Secondary | ICD-10-CM

## 2020-08-22 MED ORDER — MEDROXYPROGESTERONE ACETATE 150 MG/ML IM SUSP: 150.0000 mg | INTRAMUSCULAR | 3 refills | Status: DC
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
## 2020-10-10 ENCOUNTER — Encounter: Payer: Self-pay | Admitting: Nurse Practitioner

## 2020-10-10 DIAGNOSIS — J301 Allergic rhinitis due to pollen: Secondary | ICD-10-CM | POA: Insufficient documentation

## 2020-10-12 ENCOUNTER — Emergency Department: Payer: Medicaid Other

## 2020-10-12 ENCOUNTER — Encounter: Payer: Self-pay | Admitting: Emergency Medicine

## 2020-10-12 ENCOUNTER — Emergency Department
Admission: EM | Admit: 2020-10-12 | Discharge: 2020-10-12 | Disposition: A | Discharge status: Home / Self Care | Payer: Medicaid Other | Attending: Emergency Medicine | Admitting: Emergency Medicine

## 2020-10-12 ENCOUNTER — Other Ambulatory Visit: Payer: Self-pay

## 2020-10-12 DIAGNOSIS — M25522 Pain in left elbow: Secondary | ICD-10-CM | POA: Diagnosis not present

## 2020-10-12 DIAGNOSIS — Y9301 Activity, walking, marching and hiking: Secondary | ICD-10-CM | POA: Diagnosis not present

## 2020-10-12 DIAGNOSIS — S60511A Abrasion of right hand, initial encounter: Secondary | ICD-10-CM | POA: Diagnosis not present

## 2020-10-12 DIAGNOSIS — S61402A Unspecified open wound of left hand, initial encounter: Secondary | ICD-10-CM | POA: Insufficient documentation

## 2020-10-12 DIAGNOSIS — S80212A Abrasion, left knee, initial encounter: Secondary | ICD-10-CM | POA: Diagnosis not present

## 2020-10-12 DIAGNOSIS — Z043 Encounter for examination and observation following other accident: Secondary | ICD-10-CM | POA: Diagnosis not present

## 2020-10-12 DIAGNOSIS — S90411A Abrasion, right great toe, initial encounter: Secondary | ICD-10-CM | POA: Insufficient documentation

## 2020-10-12 DIAGNOSIS — F17290 Nicotine dependence, other tobacco product, uncomplicated: Secondary | ICD-10-CM | POA: Insufficient documentation

## 2020-10-12 DIAGNOSIS — M25532 Pain in left wrist: Secondary | ICD-10-CM

## 2020-10-12 DIAGNOSIS — W19XXXA Unspecified fall, initial encounter: Secondary | ICD-10-CM | POA: Insufficient documentation

## 2020-10-12 DIAGNOSIS — M25422 Effusion, left elbow: Secondary | ICD-10-CM

## 2020-10-12 DIAGNOSIS — M79642 Pain in left hand: Secondary | ICD-10-CM

## 2020-10-12 DIAGNOSIS — Y92481 Parking lot as the place of occurrence of the external cause: Secondary | ICD-10-CM | POA: Insufficient documentation

## 2020-10-12 DIAGNOSIS — S6992XA Unspecified injury of left wrist, hand and finger(s), initial encounter: Secondary | ICD-10-CM | POA: Diagnosis present

## 2020-10-12 MED ORDER — CYCLOBENZAPRINE HCL 10 MG PO TABS
5.0000 mg | ORAL_TABLET | Freq: Once | ORAL | Status: AC
  Administered 2020-10-12: 5 mg via ORAL
  Filled 2020-10-12: qty 1

## 2020-10-12 MED ORDER — IBUPROFEN 800 MG PO TABS
800.0000 mg | ORAL_TABLET | Freq: Once | ORAL | Status: AC
  Administered 2020-10-12: 800 mg via ORAL
  Filled 2020-10-12: qty 1

## 2020-10-12 MED ORDER — BACITRACIN-NEOMYCIN-POLYMYXIN 400-5-5000 EX OINT
TOPICAL_OINTMENT | Freq: Once | CUTANEOUS | Status: AC
  Filled 2020-10-12: qty 1

## 2020-10-12 MED ORDER — IBUPROFEN 600 MG PO TABS: 600.0000 mg | ORAL_TABLET | Freq: Three times a day (TID) | ORAL | 0 refills | Status: DC | PRN

## 2020-10-12 MED ORDER — CYCLOBENZAPRINE HCL 5 MG PO TABS: 5.0000 mg | ORAL_TABLET | Freq: Three times a day (TID) | ORAL | 0 refills | Status: DC | PRN

## 2020-10-12 NOTE — ED Notes (Signed)
See triage note  Presents s/p fall  States her heel broke  She fell  Landed on left side  Having pain mainly to left elbow area  Good pulses
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
## 2020-10-12 NOTE — ED Triage Notes (Signed)
Pt c/o falling in parking lot while walking through  Pt with injury to right great toe - bleeding controlled, left elbow (10/10) CNS intact to distal, avulsion to left medial palmer, bleeding controlled and wrapped in triage, right fourth knuckle bleeding  Pt without head strike or LOC

## 2020-10-12 NOTE — Discharge Instructions (Signed)
You were seen today for left hand, wrist and elbow pain status post fall.  The x-ray of your left hand and wrist were negative for acute findings.  The x-ray of your left elbow is concerning for an occult fracture but is unable to be fully visualized due to the swelling.  You been placed in a sling, please wear this unless you are showering until you are evaluated by Ortho.  You sustained multiple abrasions that were cleansed in the ER.  You may apply Neosporin to these areas and cover them with a Band-Aid until they are healed.  I have given you a prescription for an anti-inflammatory and muscle relaxers.  Please do not take any additional anti-inflammatories OTC such as Advil, Motrin, naproxen or Aleve.  Please be aware that the muscle relaxer may cause sedation.  I do recommend rest, ice and elevation of your left elbow to help reduce pain and swelling.  Please call orthopedics to schedule a follow-up appointment in 1 week.  I have provided you with a work note to be out of work for 2 days.

## 2020-10-13 ENCOUNTER — Encounter: Payer: Medicaid Other | Admitting: Nurse Practitioner

## 2020-10-13 ENCOUNTER — Telehealth: Payer: Self-pay | Admitting: *Deleted

## 2020-10-13 ENCOUNTER — Encounter: Payer: Medicaid Other | Admitting: Family Medicine

## 2020-10-13 NOTE — Telephone Encounter (Signed)
Transition Care Management Follow-up Telephone Call  Date of discharge and from where: 10/12/2020 [facility] ED  How have you been since you were released from the hospital? "My elbow still hurts really bad"  Any questions or concerns? No  Items Reviewed:  Did the pt receive and understand the discharge instructions provided? Yes   Medications obtained and verified? Yes   Other? No   Any new allergies since your discharge? No   Dietary orders reviewed? No  Do you have support at home? Yes    Functional Questionnaire: (I = Independent and D = Dependent) ADLs: I  Bathing/Dressing- I  Meal Prep- I  Eating- I  Maintaining continence- I  Transferring/Ambulation- I  Managing Meds- I  Follow up appointments reviewed:   PCP Hospital f/u appt confirmed? No    Specialist Hospital f/u appt confirmed? No  - advised her that if she continues to have elbow pain, she should probably see Ortho.  Are transportation arrangements needed? No   If their condition worsens, is the pt aware to call PCP or go to the Emergency Dept.? Yes  Was the patient provided with contact information for the PCP's office or ED? Yes  Was to pt encouraged to call back with questions or concerns? Yes

## 2020-10-20 DIAGNOSIS — S52125A Nondisplaced fracture of head of left radius, initial encounter for closed fracture: Secondary | ICD-10-CM | POA: Diagnosis not present

## 2020-11-12 DIAGNOSIS — S52125D Nondisplaced fracture of head of left radius, subsequent encounter for closed fracture with routine healing: Secondary | ICD-10-CM | POA: Diagnosis not present

## 2020-11-19 ENCOUNTER — Encounter: Payer: Self-pay | Admitting: Emergency Medicine

## 2021-03-06 ENCOUNTER — Ambulatory Visit (LOCAL_COMMUNITY_HEALTH_CENTER): Payer: Medicaid Other | Admitting: Advanced Practice Midwife

## 2021-03-06 ENCOUNTER — Other Ambulatory Visit: Payer: Self-pay

## 2021-03-06 ENCOUNTER — Encounter: Payer: Self-pay | Admitting: Advanced Practice Midwife

## 2021-03-06 VITALS — Resp 16 | Ht 65.0 in | Wt 170.0 lb

## 2021-03-06 DIAGNOSIS — Z3202 Encounter for pregnancy test, result negative: Secondary | ICD-10-CM | POA: Diagnosis not present

## 2021-03-06 DIAGNOSIS — Z720 Tobacco use: Secondary | ICD-10-CM

## 2021-03-06 DIAGNOSIS — Z3009 Encounter for other general counseling and advice on contraception: Secondary | ICD-10-CM

## 2021-03-06 DIAGNOSIS — Z30013 Encounter for initial prescription of injectable contraceptive: Secondary | ICD-10-CM | POA: Diagnosis not present

## 2021-03-06 DIAGNOSIS — F1290 Cannabis use, unspecified, uncomplicated: Secondary | ICD-10-CM | POA: Insufficient documentation

## 2021-03-06 DIAGNOSIS — E663 Overweight: Secondary | ICD-10-CM | POA: Insufficient documentation

## 2021-03-06 DIAGNOSIS — Z789 Other specified health status: Secondary | ICD-10-CM | POA: Insufficient documentation

## 2021-03-06 DIAGNOSIS — Z7289 Other problems related to lifestyle: Secondary | ICD-10-CM | POA: Insufficient documentation

## 2021-03-06 LAB — WET PREP FOR TRICH, YEAST, CLUE
Trichomonas Exam: NEGATIVE
Yeast Exam: NEGATIVE

## 2021-03-06 LAB — PREGNANCY, URINE: Preg Test, Ur: NEGATIVE

## 2021-03-06 MED ORDER — MEDROXYPROGESTERONE ACETATE 150 MG/ML IM SUSP
150.0000 mg | INTRAMUSCULAR | Status: AC
  Administered 2021-03-06: 150 mg via INTRAMUSCULAR

## 2021-03-06 NOTE — Progress Notes (Signed)
Patient here for annual physical and restart depo.   Depo 150 mg IM given right deltoid per order of provider Hazle Coca, CNM dated 03/06/21. Tolerated well.  Annual physical done today. Depo consent signed. Next depo due earliest 05/22/21, reminder card given.   Wet prep reviewed with provider during clinic visit. No treatment indicated per provider as patient denies sx.   Pregnancy test = negative.    Floy Sabina, RN

## 2021-03-06 NOTE — Progress Notes (Signed)
[facility] DEPARTMENT [facility] [address] Main Number: [phone]    Family Planning Visit- Initial Visit  Subjective:  Carla Hill is a 25 y.o. SBF smoker [id number] (4 yo daughter)  being seen today for an initial annual visit and to discuss contraceptive options.  The patient is currently using Condoms for pregnancy prevention. Patient reports she does not want a pregnancy in the next year.  Patient has the following medical conditions has Nicotine dependence, cigarettes, uncomplicated; HSV-2 seropositive; Allergic rhinitis due to pollen; and Overweight on their problem list.  Chief Complaint  Patient presents with  . Annual Exam  . Contraception    Depo    Patient reports wants physical and pap and DMPA today. LMP 02/20/21. Last sex 03/02/21 with condom; with current partner since 2015; 1 partner in last 3 mo. Last dental exam 2018. Last cig last week. Last vaped today TID. Last cigar today with MJ. Last ETOH 03/06/21 (3 shots  [place]) qoday. Working 40 hrs/wk and not in school. Liviing  with her mom, MGM and her daughter.   Patient denies any other partners   There is no height or weight on file to calculate BMI. - Patient is eligible for diabetes screening based on BMI and age >66?  not applicable HA1C ordered? not applicable  Patient reports 2  partner/s in last year. Desires STI screening?  Yes  Has patient been screened once for HCV in the past?  No  No results found for: HCVAB  Does the patient have current drug use (including MJ), have a partner with drug use, and/or has been incarcerated since last result? Yes  If yes-- Screen for HCV through [facility]   Does the patient meet criteria for HBV testing? No  Criteria:  -Household, sexual or needle sharing contact with HBV -History of drug use -HIV positive -Those with known Hep C   Health Maintenance Due  Topic Date Due  . PNEUMOCOCCAL POLYSACCHARIDE VACCINE AGE 35-64  HIGH RISK  Never done  . Pneumococcal Vaccine 15-62 Years old (1 - PCV) Never done  . HPV VACCINES (1 - 2-dose series) Never done  . Hepatitis C Screening  Never done  . TETANUS/TDAP  Never done  . INFLUENZA VACCINE  Never done    Review of Systems  All other systems reviewed and are negative.  The following portions of the patient's history were reviewed and updated as appropriate: allergies, current medications, past family history, past medical history, past social history, past surgical history and problem list. Problem list updated.   See flowsheet for other program required questions.  Objective:  There were no vitals filed for this visit.  Physical Exam Constitutional:      Appearance: Normal appearance. She is obese.  HENT:     Head: Normocephalic and atraumatic.     Mouth/Throat:     Mouth: Mucous membranes are moist.  Eyes:     Conjunctiva/sclera: Conjunctivae normal.  Neck:     Thyroid: No thyroid mass, thyromegaly or thyroid tenderness.  Cardiovascular:     Rate and Rhythm: Normal rate and regular rhythm.  Pulmonary:     Effort: Pulmonary effort is normal.     Breath sounds: Normal breath sounds.  Chest:  Breasts:    Right: Normal.     Left: Normal.  Abdominal:     Palpations: Abdomen is soft.     Comments: Soft without masses or tenderness, fair tone  Genitourinary:  General: Normal vulva.     Exam position: Lithotomy position.     Vagina: Vaginal discharge (grey creamy leukorrhea, ph>4.5) present.     Cervix: Normal.     Uterus: Normal.      Adnexa: Right adnexa normal and left adnexa normal.     Rectum: Normal.     Comments: Pap done Musculoskeletal:        General: Normal range of motion.     Cervical back: Normal range of motion and neck supple.  Skin:    General: Skin is warm and dry.  Neurological:     Mental Status: She is alert.  Psychiatric:        Mood and Affect: Mood normal.      Assessment and Plan:  Carla Hill is a 25  y.o. female presenting to the [facility] Department for an initial annual wellness/contraceptive visit  Contraception counseling: Reviewed all forms of birth control options in the tiered based approach. available including abstinence; over the counter/barrier methods; hormonal contraceptive medication including pill, patch, ring, injection,contraceptive implant, ECP; hormonal and nonhormonal IUDs; permanent sterilization options including vasectomy and the various tubal sterilization modalities. Risks, benefits, and typical effectiveness rates were reviewed.  Questions were answered.  Written information was also given to the patient to review.  Patient desires DMPA, this was prescribed for patient. She will follow up in  11-13 wks for surveillance.  She was told to call with any further questions, or with any concerns about this method of contraception.  Emphasized use of condoms 100% of the time for STI prevention.  Patient was offered ECP. ECP was not accepted by the patient. ECP counseling was not given - see RN documentation  1. Encounter for initial prescription of injectable contraceptive If PT neg today may have DMPA 150 mg IM q 11-13 wks x 1 year Please counsel abstinance/back up condoms next 7 days - Pregnancy, urine - medroxyPROGESTERone (DEPO-PROVERA) injection 150 mg  2. Family planning Treat wet mount per standing orders Immunization nurse consult  - WET PREP FOR TRICH, YEAST, CLUE - HIV [place] [facility] - Syphilis Serology, [place] [facility] - Chlamydia/Gonorrhea [place] [facility] - IGP, rfx Aptima HPV ASCU  3. Overweight      No follow-ups on file.  No future appointments.  Alberteen Spindle, CNM

## 2021-03-13 LAB — IGP, RFX APTIMA HPV ASCU: PAP Smear Comment: 0

## 2021-04-27 ENCOUNTER — Emergency Department
Admission: EM | Admit: 2021-04-27 | Discharge: 2021-04-27 | Discharge status: Against Medical Advice | Payer: No Typology Code available for payment source

## 2021-04-28 ENCOUNTER — Telehealth: Payer: Self-pay

## 2021-04-28 NOTE — Telephone Encounter (Signed)
Transition Care Management Unsuccessful Follow-up Telephone Call  Date of discharge and from where:  04/27/2021 from [facility]  Attempts:  1st Attempt  Reason for unsuccessful TCM follow-up call:  Unable to reach patient

## 2021-04-29 NOTE — Telephone Encounter (Signed)
Transition Care Management Unsuccessful Follow-up Telephone Call  Date of discharge and from where:  04/27/2021-[facility]  Attempts:  2nd Attempt  Reason for unsuccessful TCM follow-up call:  Unable to reach patient

## 2021-04-30 NOTE — Telephone Encounter (Signed)
Transition Care Management Unsuccessful Follow-up Telephone Call  Date of discharge and from where:  04/27/2021 from [facility]  Attempts:  3rd Attempt  Reason for unsuccessful TCM follow-up call:  Unable to reach patient

## 2021-08-04 ENCOUNTER — Other Ambulatory Visit: Payer: Self-pay

## 2021-08-04 ENCOUNTER — Emergency Department
Admission: EM | Admit: 2021-08-04 | Discharge: 2021-08-04 | Disposition: A | Discharge status: Home / Self Care | Payer: Medicaid Other | Attending: Emergency Medicine | Admitting: Emergency Medicine

## 2021-08-04 DIAGNOSIS — Z20822 Contact with and (suspected) exposure to covid-19: Secondary | ICD-10-CM | POA: Diagnosis not present

## 2021-08-04 DIAGNOSIS — R109 Unspecified abdominal pain: Secondary | ICD-10-CM | POA: Insufficient documentation

## 2021-08-04 DIAGNOSIS — N9489 Other specified conditions associated with female genital organs and menstrual cycle: Secondary | ICD-10-CM | POA: Insufficient documentation

## 2021-08-04 DIAGNOSIS — R1030 Lower abdominal pain, unspecified: Secondary | ICD-10-CM | POA: Diagnosis not present

## 2021-08-04 DIAGNOSIS — R519 Headache, unspecified: Secondary | ICD-10-CM | POA: Diagnosis not present

## 2021-08-04 LAB — CBC WITH DIFFERENTIAL/PLATELET
Abs Immature Granulocytes: 0.03 10*3/uL (ref 0.00–0.07)
Basophils Absolute: 0 10*3/uL (ref 0.0–0.1)
Basophils Relative: 1 %
Eosinophils Absolute: 0 10*3/uL (ref 0.0–0.5)
Eosinophils Relative: 1 %
HCT: 39.6 % (ref 36.0–46.0)
Hemoglobin: 13.4 g/dL (ref 12.0–15.0)
Immature Granulocytes: 1 %
Lymphocytes Relative: 9 %
Lymphs Abs: 0.5 10*3/uL — ABNORMAL LOW (ref 0.7–4.0)
MCH: 32 pg (ref 26.0–34.0)
MCHC: 33.8 g/dL (ref 30.0–36.0)
MCV: 94.5 fL (ref 80.0–100.0)
Monocytes Absolute: 0.9 10*3/uL (ref 0.1–1.0)
Monocytes Relative: 19 %
Neutro Abs: 3.5 10*3/uL (ref 1.7–7.7)
Neutrophils Relative %: 69 %
Platelets: 249 10*3/uL (ref 150–400)
RBC: 4.19 MIL/uL (ref 3.87–5.11)
RDW: 12.3 % (ref 11.5–15.5)
WBC: 5 10*3/uL (ref 4.0–10.5)
nRBC: 0 % (ref 0.0–0.2)

## 2021-08-04 LAB — COMPREHENSIVE METABOLIC PANEL
ALT: 15 U/L (ref 0–44)
AST: 20 U/L (ref 15–41)
Albumin: 4.1 g/dL (ref 3.5–5.0)
Alkaline Phosphatase: 63 U/L (ref 38–126)
Anion gap: 9 (ref 5–15)
BUN: 8 mg/dL (ref 6–20)
CO2: 22 mmol/L (ref 22–32)
Calcium: 9.4 mg/dL (ref 8.9–10.3)
Chloride: 104 mmol/L (ref 98–111)
Creatinine, Ser: 0.86 mg/dL (ref 0.44–1.00)
GFR, Estimated: >60 mL/min (ref >60)
Glucose, Bld: 98 mg/dL (ref 70–99)
Potassium: 3.2 mmol/L — ABNORMAL LOW (ref 3.5–5.1)
Sodium: 135 mmol/L (ref 135–145)
Total Bilirubin: 0.5 mg/dL (ref 0.3–1.2)
Total Protein: 7.4 g/dL (ref 6.5–8.1)

## 2021-08-04 LAB — URINALYSIS, ROUTINE W REFLEX MICROSCOPIC
Bilirubin Urine: NEGATIVE
Glucose, UA: NEGATIVE mg/dL
Ketones, ur: NEGATIVE mg/dL
Nitrite: NEGATIVE
Protein, ur: NEGATIVE mg/dL
Specific Gravity, Urine: 1.009 (ref 1.005–1.030)
pH: 6 (ref 5.0–8.0)

## 2021-08-04 LAB — HCG, QUANTITATIVE, PREGNANCY: hCG, Beta Chain, Quant, S: <1 m[IU]/mL (ref <5)

## 2021-08-04 LAB — LIPASE, BLOOD: Lipase: 28 U/L (ref 11–51)

## 2021-08-04 LAB — POC URINE PREG, ED: Preg Test, Ur: NEGATIVE

## 2021-08-04 NOTE — ED Provider Notes (Signed)
[facility] Provider Note    Event Date/Time   First MD Initiated Contact with Patient 08/04/21 1206     (approximate)  History   Chief Complaint: Abdominal Cramping  HPI  Carla Hill is a 26 y.o. female with no significant past medical history who presents to the emergency department for abdominal cramping and intermittent headaches.  According to the patient in September or October she got Depo-Provera injection.  She had an abortion in November with a D&C, but states she has not had a normal period since then.  She states over the past several days she has been cramping like she is on her menstrual cycle but states there has not been any bleeding.  Patient also states she has been experiencing intermittent headaches over the past several months as well.  Denies any fever cough or congestion.  No dysuria or hematuria.  No vaginal bleeding or discharge.  Physical Exam   Triage Vital Signs: ED Triage Vitals  Enc Vitals Group     BP 08/04/21 1147 (!) 126/93     Pulse Rate 08/04/21 1147 90     Resp 08/04/21 1147 16     Temp 08/04/21 1147 99.8 F (37.7 C)     Temp Source 08/04/21 1147 Oral     SpO2 08/04/21 1147 98 %     Weight 08/04/21 1148 165 lb (74.8 kg)     Height 08/04/21 1148 5\' 6"  (1.676 m)     Head Circumference --      Peak Flow --      Pain Score 08/04/21 1156 8     Pain Loc --      Pain Edu? --      Excl. in [place]? --     Most recent vital signs: Vitals:   08/04/21 1147  BP: (!) 126/93  Pulse: 90  Resp: 16  Temp: 99.8 F (37.7 C)  SpO2: 98%    General: Awake, no distress.  CV:  Good peripheral perfusion.  Regular rate and rhythm  Resp:  Normal effort.  Equal breath sounds bilaterally.  Abd:  No distention.  Soft, nontender.  No rebound or guarding.   ED Results / Procedures / Treatments   MEDICATIONS ORDERED IN ED: Medications - No data to display   IMPRESSION / MDM / [place] / ED COURSE  I reviewed the triage  vital signs and the nursing notes.  Patient presents to the emergency department for lower abdominal cramping as well as intermittent headaches.  Patient states symptoms have been ongoing for weeks or months.  On examination patient appears well, has a benign abdomen, no tenderness to palpation.  We will check labs including a pregnancy test we will obtain urinalysis to rule out urinary tract infection we will continue to closely monitor while awaiting results.  Patient agreeable to plan of care.  Patient's work-up is reassuring.  Pregnancy test is negative, beta-hCG quantitative test is negative.  Chemistry overall reassuring.  Normal-appearing CBC negative lipase.  Urinalysis shows small amount of blood otherwise negative.  Given the patient's reassuring work-up I believe the patient is safe for discharge home.  She states she has been experiencing some mild congestion she does have a low-grade temperature 99.8 in the emergency department but the patient states she had a COVID test performed this morning and she will get the results back today or tomorrow she does not want another test.  I believe this is reasonable.  As the  patient otherwise appears very well I believe she is safe for discharge home.  FINAL CLINICAL IMPRESSION(S) / ED DIAGNOSES   Abdominal cramping Headache   Note:  This document was prepared using Dragon voice recognition software and may include unintentional dictation errors.   Harvest Dark, MD 08/04/21 1349

## 2021-08-04 NOTE — ED Triage Notes (Signed)
Pt here with abd pain, cramps, and migraines. Pt does not remember her last menstrual, pt had an abortion in Nov. Pt in NAD in triage.

## 2021-08-04 NOTE — ED Notes (Signed)
See triage note. Pt states she received depo shot for birth control in Sept 2022 around the same time she became pregnant. Abortion procedure was in November and she was told that her cycles should resume within six weeks. Pt states she has not had a cycle since the depo injection. Cramping began approx 2 days ago. Appetite somewhat diminished. Pt states urination and bowel movements normal.

## 2021-08-04 NOTE — ED Notes (Addendum)
Pt states she just had a covid test today at [facility] and declined having it done here. Provider notified.

## 2021-08-05 ENCOUNTER — Telehealth: Payer: Self-pay | Admitting: *Deleted

## 2021-08-05 NOTE — Telephone Encounter (Signed)
Transition Care Management Follow-up Telephone Call Date of discharge and from where: 08/04/2021  [facility] ED How have you been since you were released from the hospital? "I am about the same" Any questions or concerns? No  Items Reviewed: Did the pt receive and understand the discharge instructions provided? Yes  Medications obtained and verified?  N/A Other? No  Any new allergies since your discharge? No  Dietary orders reviewed? No Do you have support at home? No    Functional Questionnaire: (I = Independent and D = Dependent) ADLs: I  Bathing/Dressing- I  Meal Prep- I  Eating- I  Maintaining continence- I  Transferring/Ambulation- I  Managing Meds- I  Follow up appointments reviewed:  PCP Hospital f/u appt confirmed? No  - no PCP Specialist Hospital f/u appt confirmed? No   Are transportation arrangements needed? No  If their condition worsens, is the pt aware to call PCP or go to the Emergency Dept.? Yes Was the patient provided with contact information for the PCP's office or ED? Yes Was to pt encouraged to call back with questions or concerns? Yes

## 2021-08-11 DIAGNOSIS — Z20822 Contact with and (suspected) exposure to covid-19: Secondary | ICD-10-CM | POA: Diagnosis not present

## 2021-10-08 DIAGNOSIS — Z20822 Contact with and (suspected) exposure to covid-19: Secondary | ICD-10-CM | POA: Diagnosis not present

## 2021-12-14 ENCOUNTER — Other Ambulatory Visit: Payer: Self-pay

## 2021-12-14 ENCOUNTER — Emergency Department
Admission: EM | Admit: 2021-12-14 | Discharge: 2021-12-14 | Disposition: A | Discharge status: Home / Self Care | Payer: Medicaid Other | Attending: Emergency Medicine | Admitting: Emergency Medicine

## 2021-12-14 DIAGNOSIS — W57XXXA Bitten or stung by nonvenomous insect and other nonvenomous arthropods, initial encounter: Secondary | ICD-10-CM | POA: Insufficient documentation

## 2021-12-14 DIAGNOSIS — S70361A Insect bite (nonvenomous), right thigh, initial encounter: Secondary | ICD-10-CM | POA: Insufficient documentation

## 2021-12-14 DIAGNOSIS — L0889 Other specified local infections of the skin and subcutaneous tissue: Secondary | ICD-10-CM | POA: Insufficient documentation

## 2021-12-14 DIAGNOSIS — S70362A Insect bite (nonvenomous), left thigh, initial encounter: Secondary | ICD-10-CM | POA: Diagnosis not present

## 2021-12-14 DIAGNOSIS — S80861A Insect bite (nonvenomous), right lower leg, initial encounter: Secondary | ICD-10-CM | POA: Diagnosis not present

## 2021-12-14 DIAGNOSIS — S80862A Insect bite (nonvenomous), left lower leg, initial encounter: Secondary | ICD-10-CM | POA: Diagnosis not present

## 2021-12-14 MED ORDER — CEPHALEXIN 500 MG PO CAPS
500.0000 mg | ORAL_CAPSULE | Freq: Once | ORAL | Status: AC
Start: 2021-12-14 — End: 2021-12-14
  Administered 2021-12-14: 500 mg via ORAL
  Filled 2021-12-14: qty 1

## 2021-12-14 MED ORDER — TRIAMCINOLONE ACETONIDE 0.1 % EX CREA: 1.0000 | TOPICAL_CREAM | Freq: Four times a day (QID) | CUTANEOUS | 0 refills | Status: AC

## 2021-12-14 MED ORDER — CEPHALEXIN 500 MG PO CAPS
1000.0000 mg | ORAL_CAPSULE | Freq: Two times a day (BID) | ORAL | 0 refills | Status: AC
Start: 2021-12-14 — End: ?

## 2021-12-14 NOTE — ED Triage Notes (Signed)
Pt presents to ER c/o bug bites to BIL upper legs that happened Saturday.  Pt has red area noted to left posterior upper leg that pt states has been getting larger in area.  Area is somewhat painful, red and warm to touch.  Pt states area started out as a blister that recently popped.  Pt ambulatory to triage.  Pt A&O x4 at this time in NAD.

## 2021-12-14 NOTE — ED Provider Notes (Signed)
[facility] Provider Note  Patient Contact: 10:11 PM (approximate)   History   Insect Bite and Cellulitis   HPI  Carla Hill is a 26 y.o. female who presents the emergency department complaining of possible infection or reaction to bug bites.  She states that Saturday night, 2 nights ago she noticed 3 areas that she thought was mosquito bites.  However they have enlarge, become slightly painful.  Patient has no history of recurrent skin infections.  No fevers or chills.  There is no drainage from these areas.     Physical Exam   Triage Vital Signs: ED Triage Vitals  Enc Vitals Group     BP 12/14/21 2017 (!) 132/99     Pulse Rate 12/14/21 2017 80     Resp 12/14/21 2017 18     Temp 12/14/21 2017 98.2 F (36.8 C)     Temp Source 12/14/21 2017 Oral     SpO2 12/14/21 2017 100 %     Weight 12/14/21 2023 180 lb (81.6 kg)     Height 12/14/21 2023 5\' 6"  (1.676 m)     Head Circumference --      Peak Flow --      Pain Score 12/14/21 2023 8     Pain Loc --      Pain Edu? --      Excl. in [place]? --     Most recent vital signs: Vitals:   12/14/21 2017  BP: (!) 132/99  Pulse: 80  Resp: 18  Temp: 98.2 F (36.8 C)  SpO2: 100%     General: Alert and in no acute distress.   Cardiovascular:  Good peripheral perfusion Respiratory: Normal respiratory effort without tachypnea or retractions. Lungs CTAB. Musculoskeletal: Full range of motion to all extremities.  Neurologic:  No gross focal neurologic deficits are appreciated.  Skin:   No rash noted patient has 3 lesions to her lower legs.  2 or to the right thigh, 1 to the left thigh.  Patient has central excoriations from scratching.  There is surrounding erythema of 1 that looks like a localized histamine reaction, the other area to the right thigh appears to have slight cellulitis around it.  Left thigh has findings concerning for cellulitis without abscess.  There is slight tenderness but no fluctuance,  induration or purulent drainage. Other:   ED Results / Procedures / Treatments   Labs (all labs ordered are listed, but only abnormal results are displayed) Labs Reviewed - No data to display   EKG     RADIOLOGY    No results found.  PROCEDURES:  Critical Care performed: No  Procedures   MEDICATIONS ORDERED IN ED: Medications  cephALEXin (KEFLEX) capsule 500 mg (has no administration in time range)     IMPRESSION / MDM / ASSESSMENT AND PLAN / ED COURSE  I reviewed the triage vital signs and the nursing notes.                              Differential diagnosis includes, but is not limited to, insect bite, allergic reaction, cellulitis, abscess   Patient's presentation is most consistent with acute illness / injury with system symptoms.   Patient's diagnosis is consistent with infected bug bite.  Patient presents to the ED with concerns that she may be having allergic reaction versus infection from bug bites.  Patient has 3 lesions to the 2 legs.  2  appear to have slight cellulitis around same.  No evidence of abscess.  No indication for labs or imaging.  Patient was given a dose of antibiotics here.  Patient will be discharged with Keflex.  Follow-up primary care as needed.  Return to the ED for any new or worsening symptoms.        FINAL CLINICAL IMPRESSION(S) / ED DIAGNOSES   Final diagnoses:  Bug bite with infection, initial encounter     Rx / DC Orders   ED Discharge Orders          Ordered    cephALEXin (KEFLEX) 500 MG capsule  2 times daily        12/14/21 2239    triamcinolone cream (KENALOG) 0.1 %  4 times daily        12/14/21 2240             Note:  This document was prepared using Dragon voice recognition software and may include unintentional dictation errors.   Lanette Hampshire 12/14/21 2240    Minna Antis, MD 12/14/21 2258

## 2021-12-14 NOTE — ED Notes (Signed)
Pt discharge information reviewed. Pt understands need for follow up care and when to return if symptoms worsen. All questions answered. Pt is alert and oriented with even and regular respirations. Pt is seen ambulating out of department with string steady gait.   

## 2022-01-27 IMAGING — CR DG HAND 2V*L*
2 series · 2 of 2 positions shown · non-contrast
Comparison: None.

CLINICAL DATA: Fall

EXAM:
LEFT HAND - 2 VIEW

[hand ap]
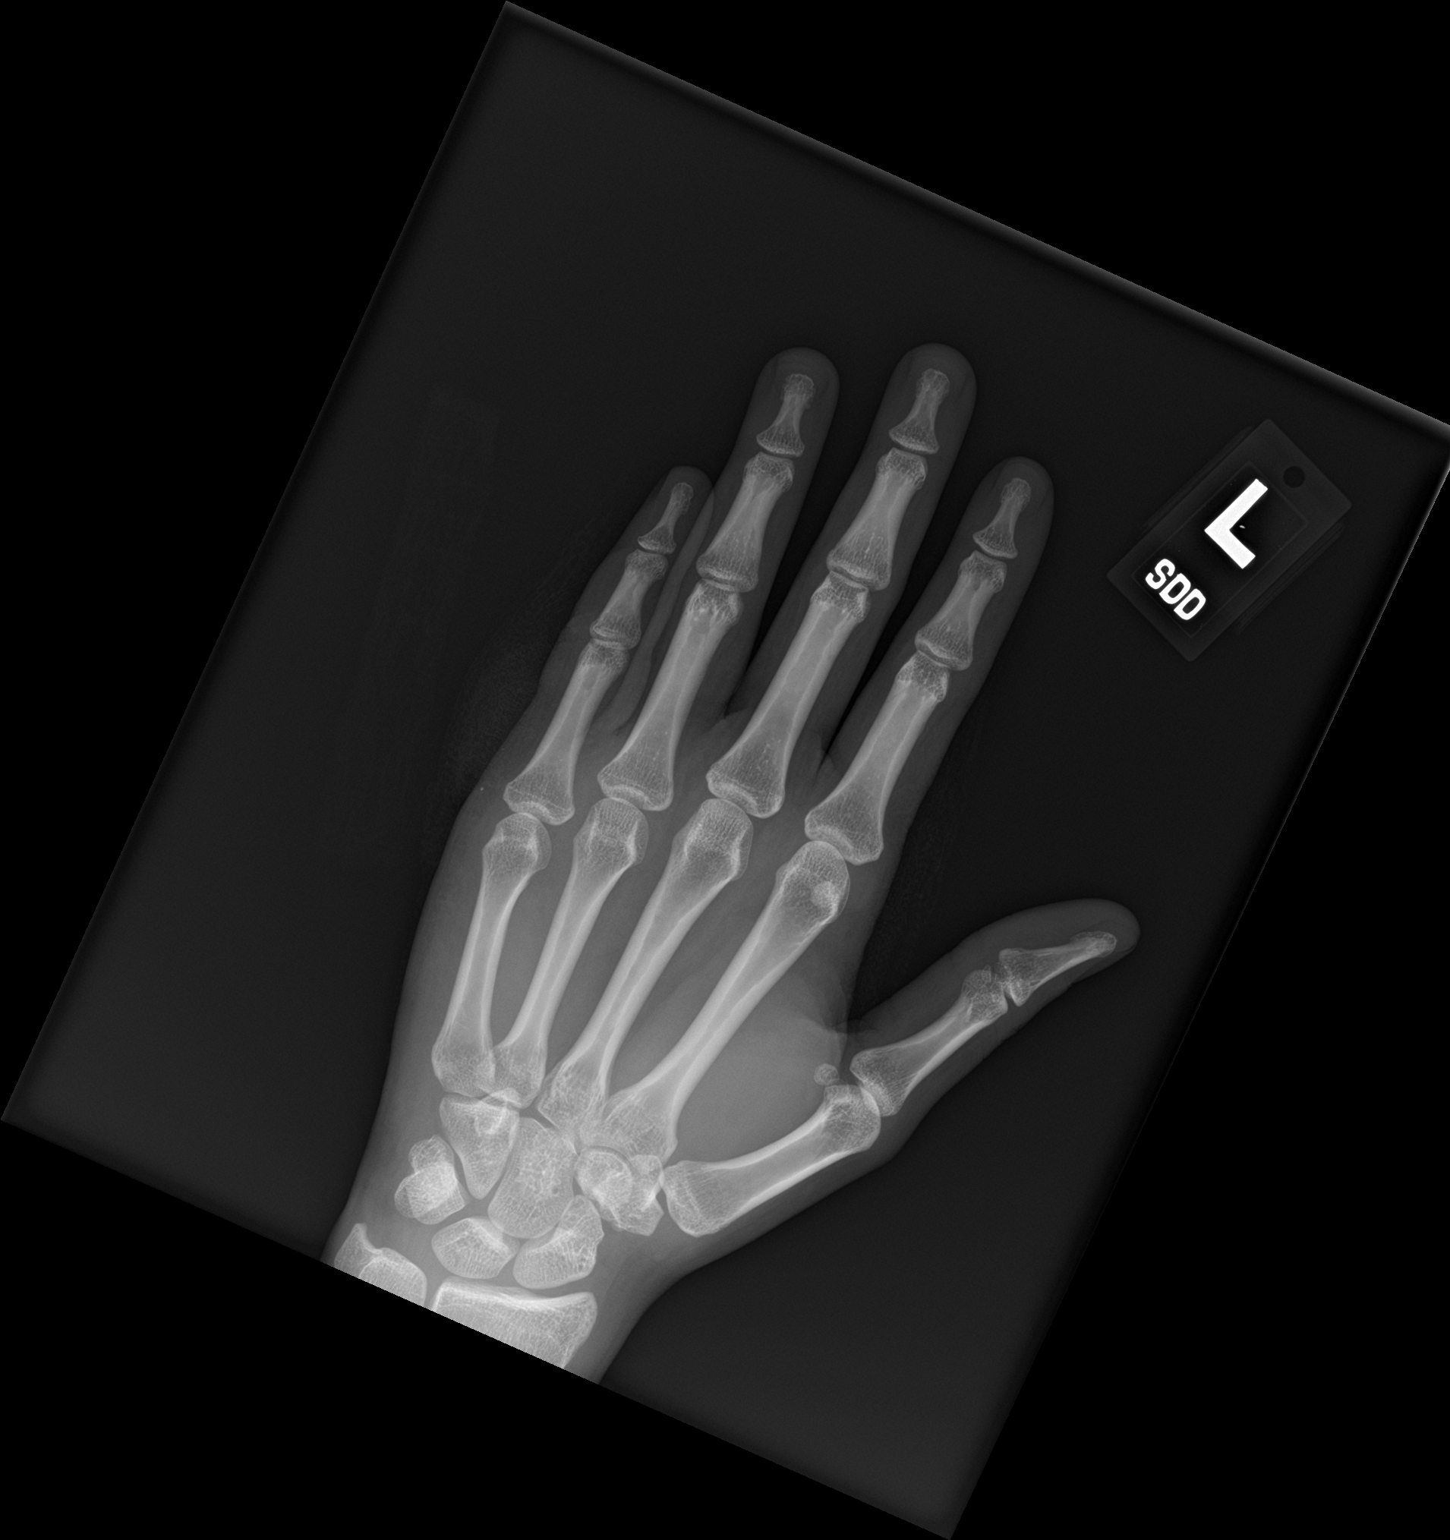

[hand lat]
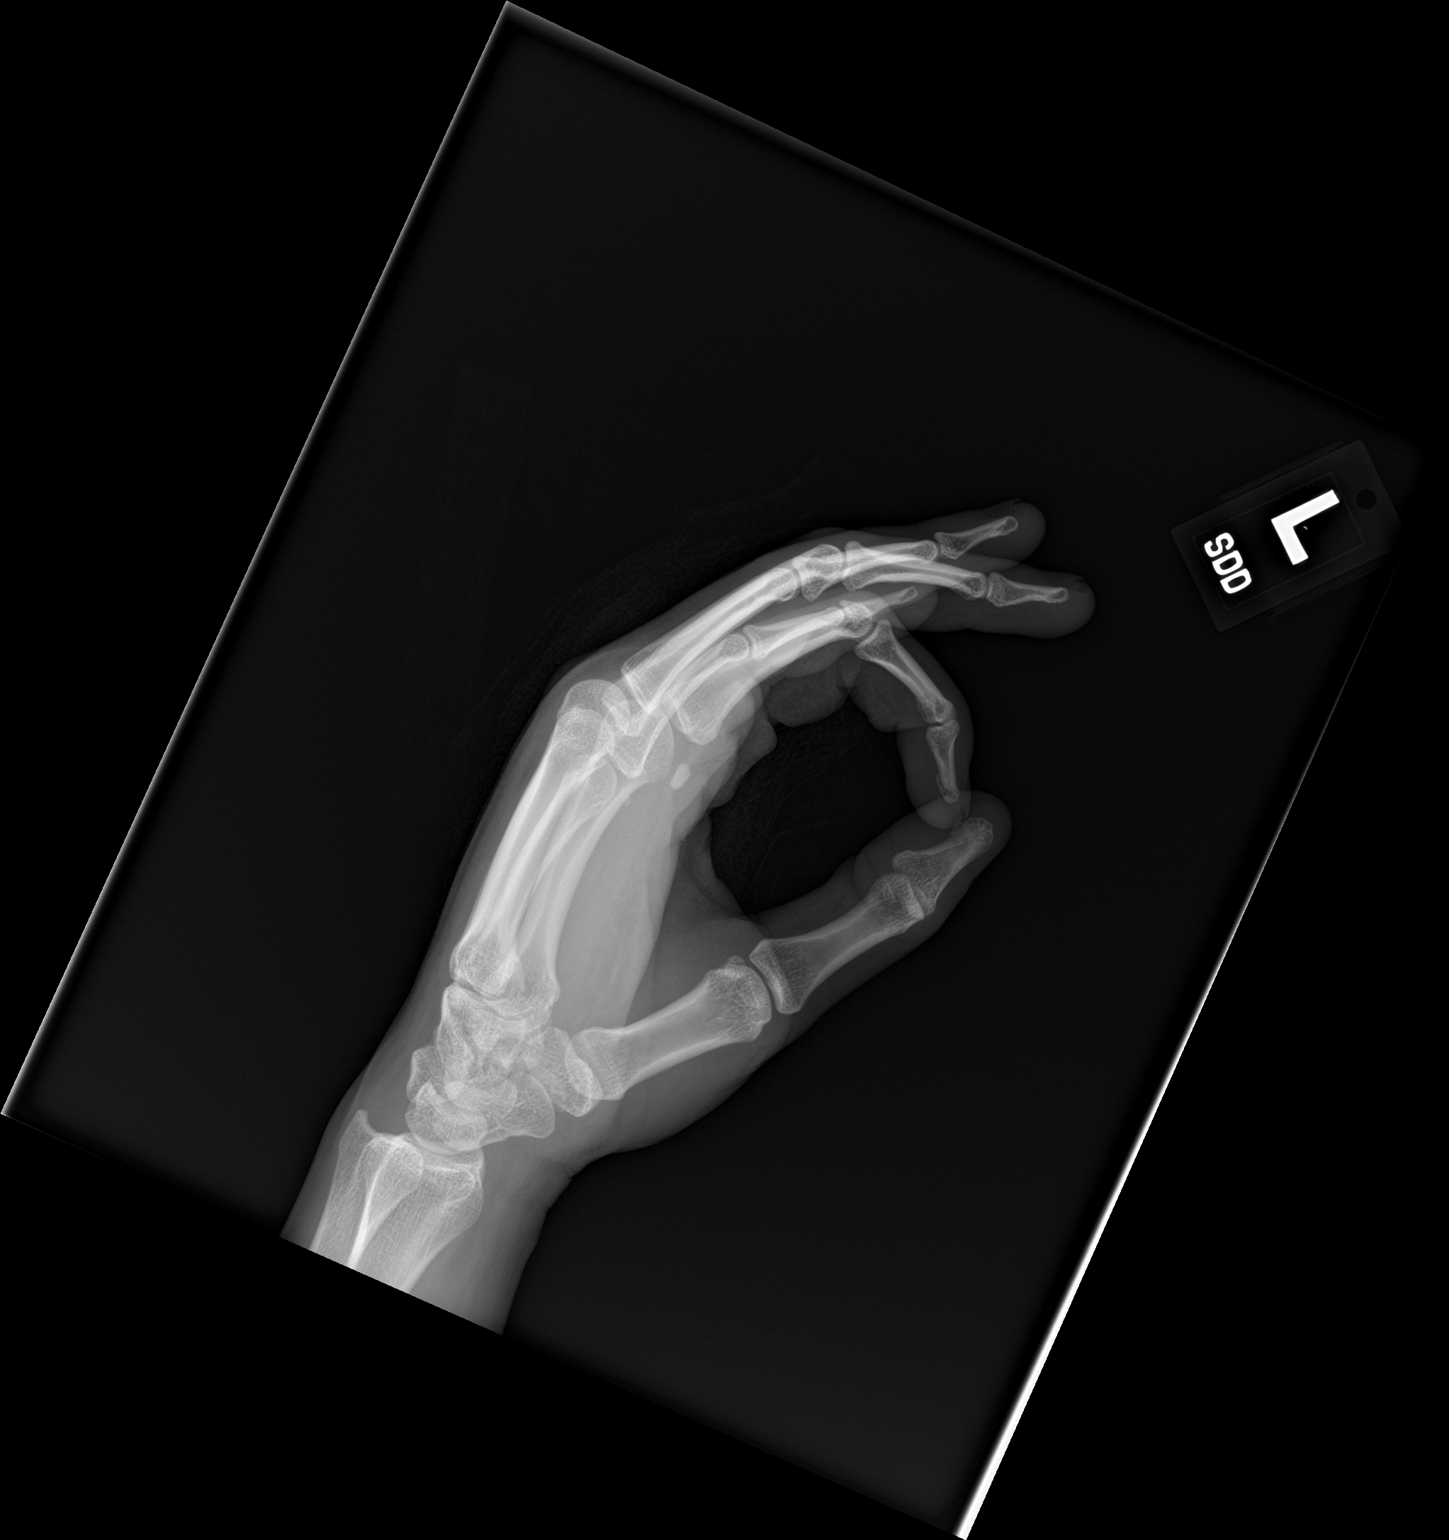

[2 of 2 positions shown; findings below may reference images not displayed]

FINDINGS: There is no evidence of fracture or dislocation. There is no
evidence of arthropathy or other focal bone abnormality. Soft
tissues are unremarkable.
IMPRESSION: Negative.

## 2022-01-27 IMAGING — CR DG ELBOW 2V*L*
2 series · 2 of 2 positions shown · non-contrast
Comparison: None.

CLINICAL DATA: Fall, elbow pain

EXAM:
LEFT ELBOW - 2 VIEW

[elbow obl]
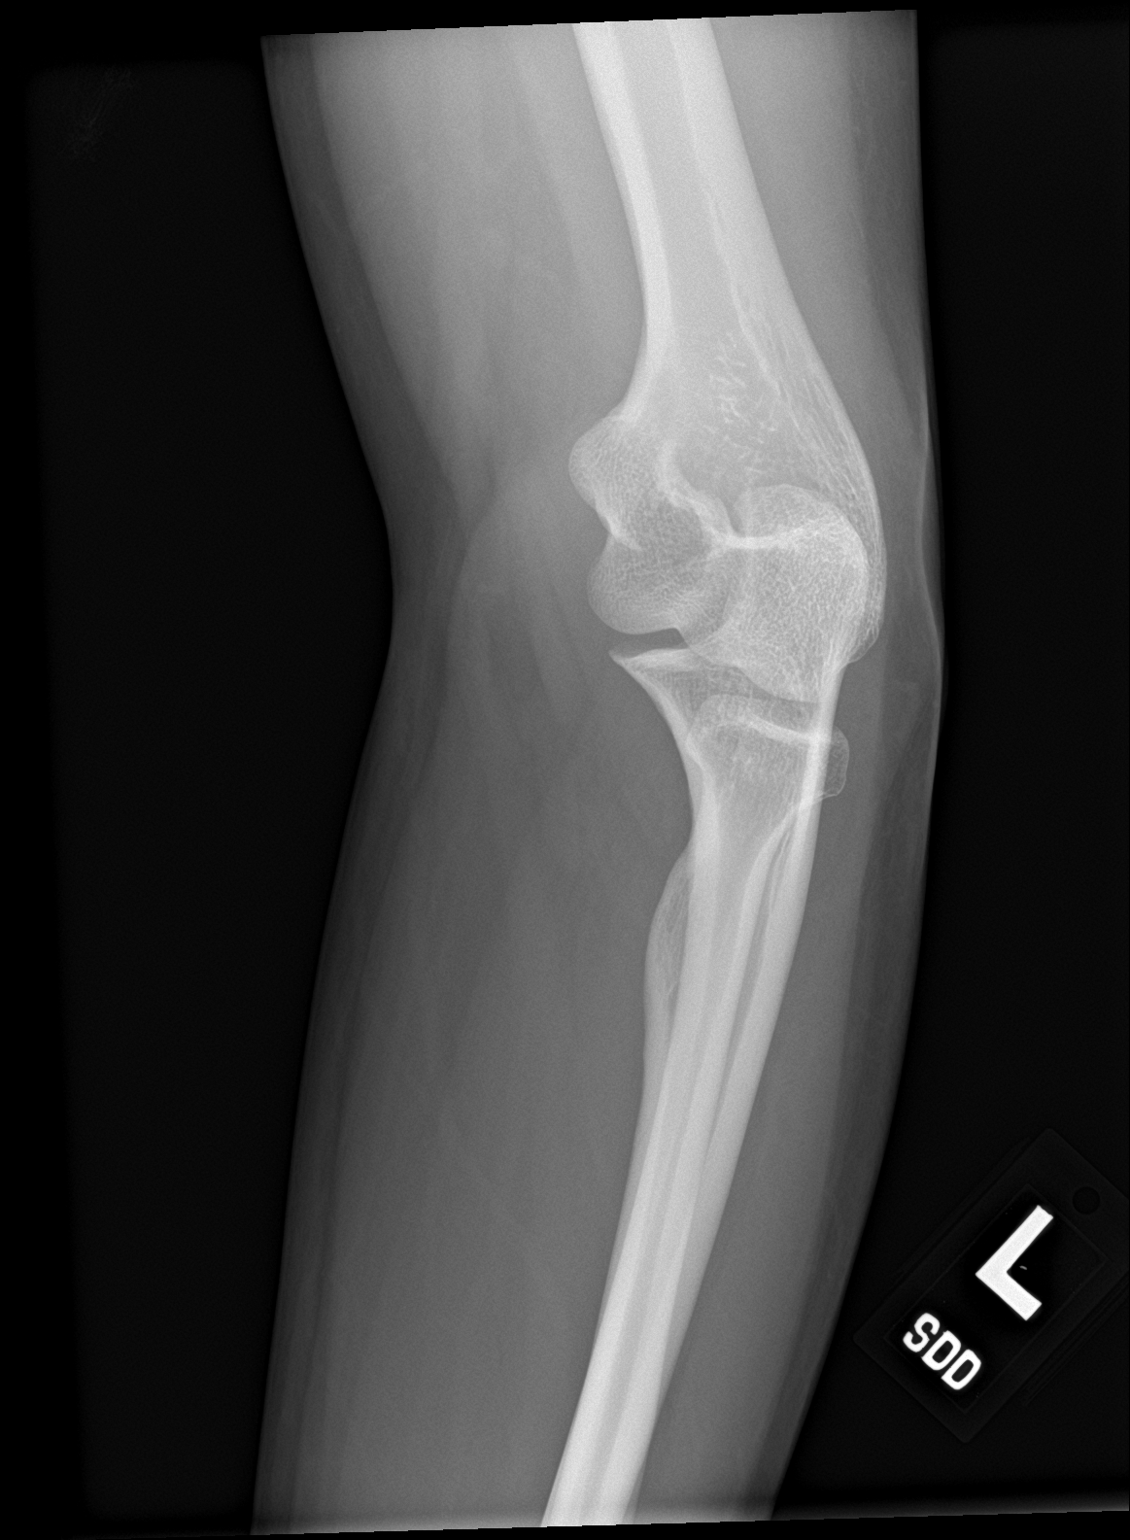

[elbow lat]
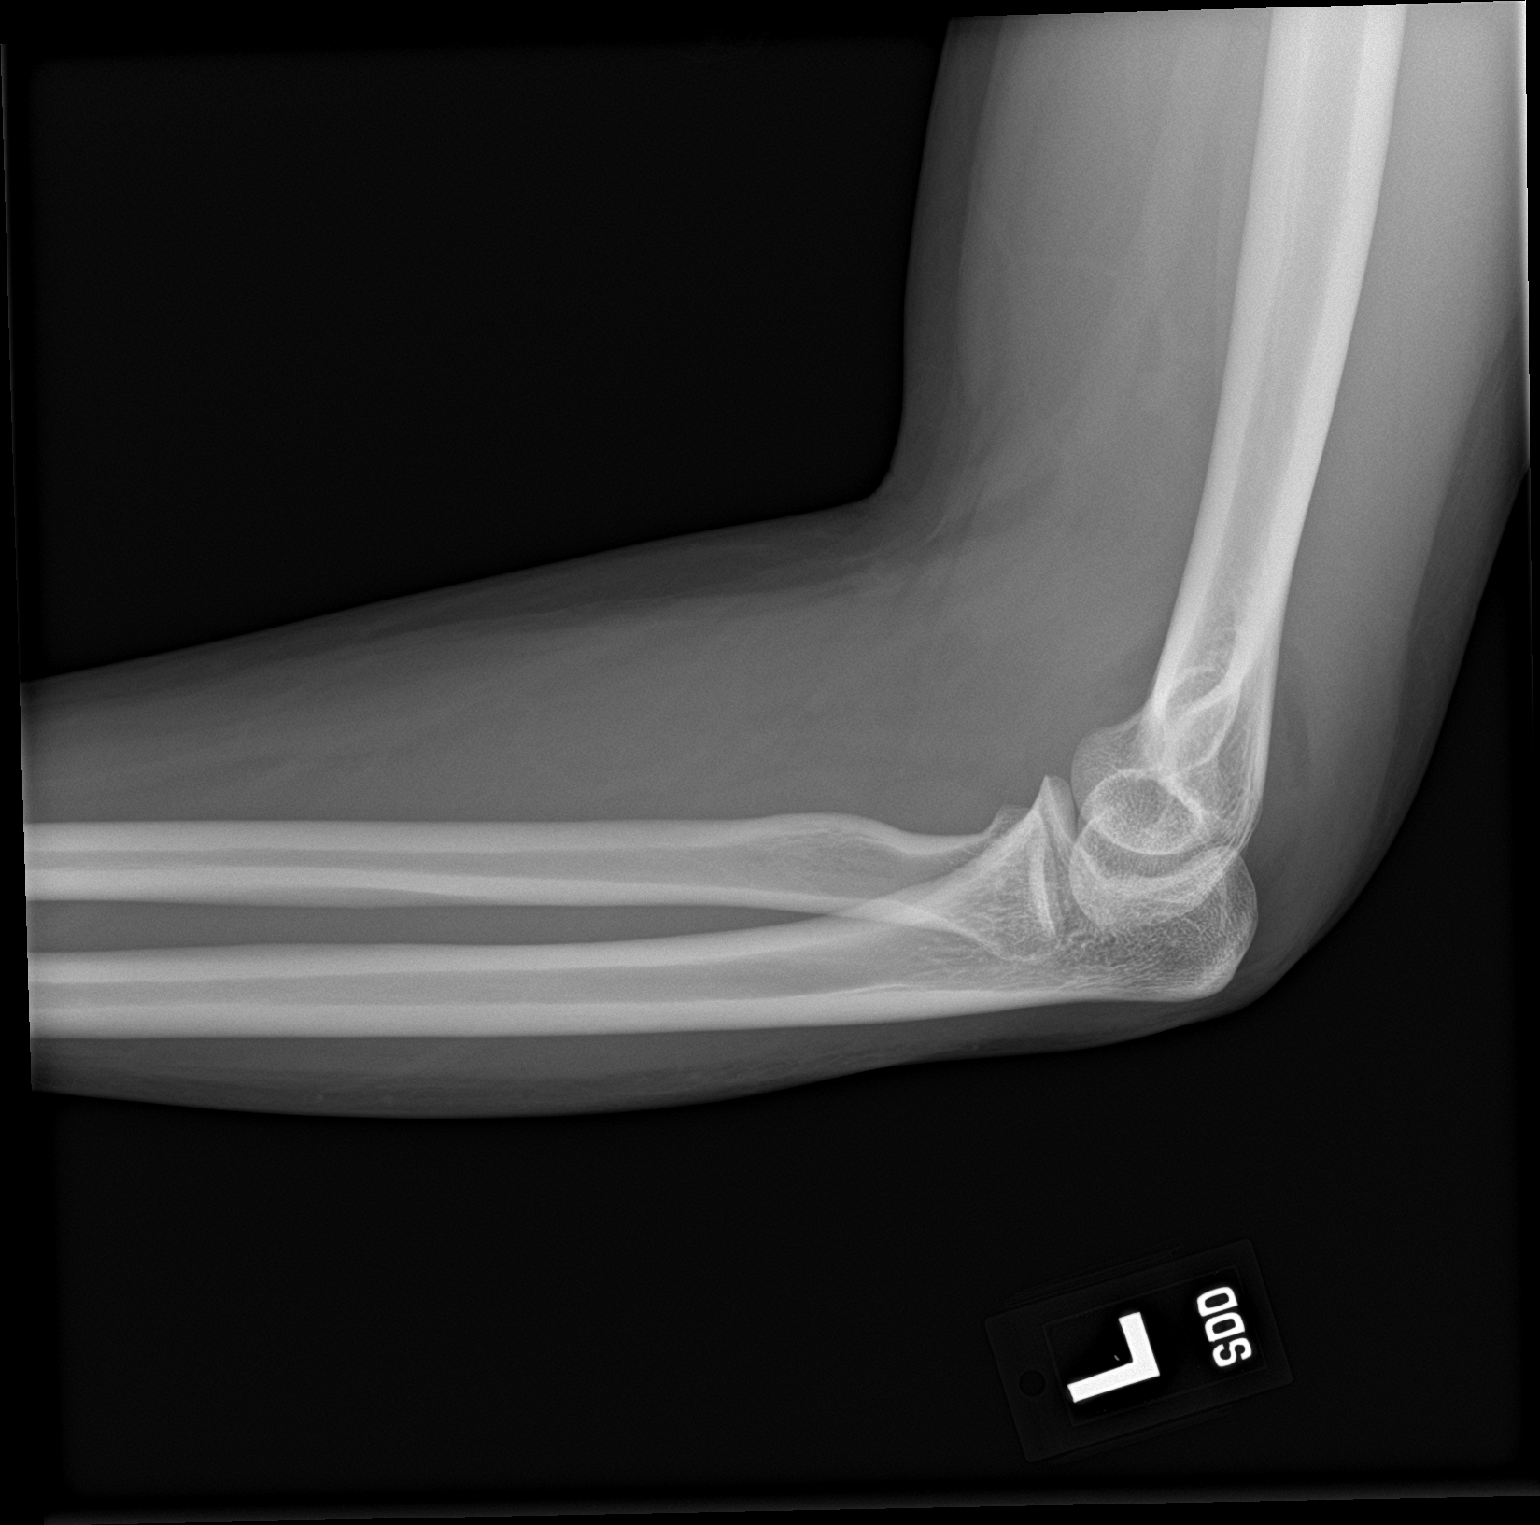

[2 of 2 positions shown; findings below may reference images not displayed]

FINDINGS: There is a left elbow joint effusion. No visible fracture. No
subluxation or dislocation. Soft tissues are intact.
IMPRESSION: Left elbow effusion without visible fracture. Findings concerning
for possible occult fracture. Consider immobilization and repeat
imaging in 1 week if symptoms persist.

## 2022-08-26 DIAGNOSIS — Z20822 Contact with and (suspected) exposure to covid-19: Secondary | ICD-10-CM | POA: Diagnosis not present

## 2022-08-26 DIAGNOSIS — N946 Dysmenorrhea, unspecified: Secondary | ICD-10-CM | POA: Diagnosis not present

## 2022-08-26 DIAGNOSIS — A084 Viral intestinal infection, unspecified: Secondary | ICD-10-CM | POA: Diagnosis not present

## 2022-08-27 ENCOUNTER — Emergency Department
Admission: EM | Admit: 2022-08-27 | Discharge: 2022-08-27 | Disposition: A | Discharge status: Home / Self Care | Payer: Medicaid Other | Attending: Student in an Organized Health Care Education/Training Program | Admitting: Student in an Organized Health Care Education/Training Program

## 2022-08-27 ENCOUNTER — Other Ambulatory Visit: Payer: Self-pay

## 2022-08-27 ENCOUNTER — Encounter: Payer: Self-pay | Admitting: Radiology

## 2022-08-27 DIAGNOSIS — R1084 Generalized abdominal pain: Secondary | ICD-10-CM

## 2022-08-27 DIAGNOSIS — R112 Nausea with vomiting, unspecified: Secondary | ICD-10-CM | POA: Diagnosis not present

## 2022-08-27 LAB — URINALYSIS, ROUTINE W REFLEX MICROSCOPIC
Bacteria, UA: NONE SEEN
Bilirubin Urine: NEGATIVE
Glucose, UA: NEGATIVE mg/dL
Ketones, ur: NEGATIVE mg/dL
Leukocytes,Ua: NEGATIVE
Nitrite: NEGATIVE
Protein, ur: NEGATIVE mg/dL
Specific Gravity, Urine: 1.011 (ref 1.005–1.030)
pH: 7 (ref 5.0–8.0)

## 2022-08-27 LAB — COMPREHENSIVE METABOLIC PANEL
ALT: 36 U/L (ref 0–44)
AST: 42 U/L — ABNORMAL HIGH (ref 15–41)
Albumin: 3.9 g/dL (ref 3.5–5.0)
Alkaline Phosphatase: 70 U/L (ref 38–126)
Anion gap: 9 (ref 5–15)
BUN: 8 mg/dL (ref 6–20)
CO2: 26 mmol/L (ref 22–32)
Calcium: 8.9 mg/dL (ref 8.9–10.3)
Chloride: 103 mmol/L (ref 98–111)
Creatinine, Ser: 0.77 mg/dL (ref 0.44–1.00)
GFR, Estimated: >60 mL/min (ref >60)
Glucose, Bld: 86 mg/dL (ref 70–99)
Potassium: 3.1 mmol/L — ABNORMAL LOW (ref 3.5–5.1)
Sodium: 138 mmol/L (ref 135–145)
Total Bilirubin: 0.5 mg/dL (ref 0.3–1.2)
Total Protein: 7.8 g/dL (ref 6.5–8.1)

## 2022-08-27 LAB — WET PREP, GENITAL
Clue Cells Wet Prep HPF POC: NONE SEEN
Sperm: NONE SEEN
Trich, Wet Prep: NONE SEEN
WBC, Wet Prep HPF POC: <10 (ref <10)
Yeast Wet Prep HPF POC: NONE SEEN

## 2022-08-27 LAB — CBC
HCT: 41.6 % (ref 36.0–46.0)
Hemoglobin: 13.8 g/dL (ref 12.0–15.0)
MCH: 32 pg (ref 26.0–34.0)
MCHC: 33.2 g/dL (ref 30.0–36.0)
MCV: 96.5 fL (ref 80.0–100.0)
Platelets: 296 10*3/uL (ref 150–400)
RBC: 4.31 MIL/uL (ref 3.87–5.11)
RDW: 12.7 % (ref 11.5–15.5)
WBC: 7.3 10*3/uL (ref 4.0–10.5)
nRBC: 0 % (ref 0.0–0.2)

## 2022-08-27 LAB — CHLAMYDIA/NGC RT PCR (ARMC ONLY)
Chlamydia Tr: NOT DETECTED
N gonorrhoeae: NOT DETECTED

## 2022-08-27 LAB — HIV ANTIBODY (ROUTINE TESTING W REFLEX): HIV Screen 4th Generation wRfx: NONREACTIVE

## 2022-08-27 LAB — LIPASE, BLOOD: Lipase: 44 U/L (ref 11–51)

## 2022-08-27 LAB — POC URINE PREG, ED: Preg Test, Ur: NEGATIVE

## 2022-08-27 MED ORDER — ONDANSETRON 4 MG PO TBDP: 4.0000 mg | ORAL_TABLET | Freq: Once | ORAL | Status: DC

## 2022-08-27 MED ORDER — POTASSIUM CHLORIDE CRYS ER 20 MEQ PO TBCR
40.0000 meq | EXTENDED_RELEASE_TABLET | Freq: Once | ORAL | Status: AC
  Administered 2022-08-27: 40 meq via ORAL
  Filled 2022-08-27: qty 2

## 2022-08-27 NOTE — ED Notes (Signed)
Rn to bedside to introduce self to pt. Pt resting and in no acute distress

## 2022-08-27 NOTE — ED Triage Notes (Signed)
Pt states her stomach has been hurting since Wednesday. Pt states nausea and has vomited a couple of times. No fever or diarrhea. Pt also requesting to be checked for gonorrhea and chlamydia due to having unprotected sex. Denies discharge or odor.

## 2022-08-27 NOTE — ED Provider Notes (Signed)
[facility] Provider Note    Event Date/Time   First MD Initiated Contact with Patient 08/27/22 1818     (approximate)   History   Abdominal Pain   HPI  Carla Hill is a 27 y.o. female with history of topic pregnancy presents to the ER for evaluation of some nausea generalized crampy abdominal pain.  She is on her menstrual cycle currently but states that she was a little bit late and has been a little bit abnormal for her.  Denies any specific pain.  Has taken some home pregnancy test which were negative.  She has had unprotected intercourse and would like to be screened for STD though denies any specific symptoms.  No diarrhea.  No fevers or chills.  No flank pain no dysuria.     Physical Exam   Triage Vital Signs: ED Triage Vitals  Enc Vitals Group     BP 08/27/22 1811 (!) 143/101     Pulse Rate 08/27/22 1811 95     Resp 08/27/22 1811 17     Temp 08/27/22 1811 98.1 F (36.7 C)     Temp Source 08/27/22 1811 Oral     SpO2 08/27/22 1811 97 %     Weight 08/27/22 1759 180 lb (81.6 kg)     Height 08/27/22 1759 '5\' 6"'$  (1.676 m)     Head Circumference --      Peak Flow --      Pain Score --      Pain Loc --      Pain Edu? --      Excl. in [place]? --     Most recent vital signs: Vitals:   08/27/22 1811  BP: (!) 143/101  Pulse: 95  Resp: 17  Temp: 98.1 F (36.7 C)  SpO2: 97%     Constitutional: Alert  Eyes: Conjunctivae are normal.  Head: Atraumatic. Nose: No congestion/rhinnorhea. Mouth/Throat: Mucous membranes are moist.   Neck: Painless ROM.  Cardiovascular:   Good peripheral circulation. Respiratory: Normal respiratory effort.  No retractions.  Gastrointestinal: Soft and nontender in all 4 quadrants. Musculoskeletal:  no deformity Neurologic:  MAE spontaneously. No gross focal neurologic deficits are appreciated.  Skin:  Skin is warm, dry and intact. No rash noted. Psychiatric: Mood and affect are normal. Speech and behavior are  normal.    ED Results / Procedures / Treatments   Labs (all labs ordered are listed, but only abnormal results are displayed) Labs Reviewed  COMPREHENSIVE METABOLIC PANEL - Abnormal; Notable for the following components:      Result Value   Potassium 3.1 (*)    AST 42 (*)    All other components within normal limits  URINALYSIS, ROUTINE W REFLEX MICROSCOPIC - Abnormal; Notable for the following components:   Color, Urine YELLOW (*)    APPearance CLEAR (*)    Hgb urine dipstick MODERATE (*)    All other components within normal limits  WET PREP, GENITAL  CHLAMYDIA/NGC RT PCR ([facility] ONLY)            LIPASE, BLOOD  CBC  HIV ANTIBODY (ROUTINE TESTING W REFLEX)  POC URINE PREG, ED     EKG     RADIOLOGY    PROCEDURES:  Critical Care performed:   Procedures   MEDICATIONS ORDERED IN ED: Medications  potassium chloride SA (KLOR-CON M) CR tablet 40 mEq (has no administration in time range)     IMPRESSION / MDM / ASSESSMENT AND PLAN /  ED COURSE  I reviewed the triage vital signs and the nursing notes.                              Differential diagnosis includes, but is not limited to, cystitis, STD, PID, appendicitis, colitis, gastritis, enteritis, TOA, torsion  Presenting to the ER well-appearing in no acute distress with symptoms as described above.  She is a completely benign exam.  Does not appear clinically consistent with appendicitis, TOA, PID.  Possible foodborne illness.  Blood work sent for the blood differential.  She does consent to STD screening including HIV.   Clinical Course as of 08/27/22 1904  Fri Aug 27, 2022  1848 Patient is not pregnant.  No anemia no white count.  Given her benign abdominal exam do not feel that further diagnostic testing clinically indicated.  This not consistent with torsion.  UA without evidence of infection. [PR]  1902 Reassessed.  Remains well-appearing no acute distress.  Wet prep negative.  Patient will follow-up results  of remainder of test on MyChart as I do believe she stable and appropriate for outpatient follow-up.  Will be given potassium supplement.  She states that she does not need any additional antiemetics is not complaining of any nausea at this time.  Does appear stable and appropriate for outpatient follow-up. [PR]    Clinical Course User Index [PR] Merlyn Lot, MD     FINAL CLINICAL IMPRESSION(S) / ED DIAGNOSES   Final diagnoses:  Generalized abdominal pain     Rx / DC Orders   ED Discharge Orders     None        Note:  This document was prepared using Dragon voice recognition software and may include unintentional dictation errors.    Merlyn Lot, MD 08/27/22 [id number]

## 2022-08-27 NOTE — Discharge Instructions (Signed)

## 2022-08-27 NOTE — ED Notes (Signed)
Blue top tube sent to lab as well.

## 2022-08-30 ENCOUNTER — Telehealth: Payer: Self-pay

## 2022-08-30 NOTE — Transitions of Care (Post Inpatient/ED Visit) (Signed)
   08/30/2022  Name: Carla Hill MRN: [id number] DOB: [birth year]  Today's TOC FU Call Status: Today's TOC FU Call Status:: Unsuccessul Call (1st Attempt) Unsuccessful Call (1st Attempt) Date: 08/30/22  Attempted to reach the patient regarding the most recent Inpatient/ED visit.  Follow Up Plan: Additional outreach attempts will be made to reach the patient to complete the Transitions of Care (Post Inpatient/ED visit) call.   Mickel Fuchs, BSW, [place] Managed Medicaid Team  [phone]

## 2022-10-01 DIAGNOSIS — R519 Headache, unspecified: Secondary | ICD-10-CM | POA: Diagnosis not present

## 2022-10-01 DIAGNOSIS — J069 Acute upper respiratory infection, unspecified: Secondary | ICD-10-CM | POA: Diagnosis not present

## 2022-10-01 DIAGNOSIS — Z20822 Contact with and (suspected) exposure to covid-19: Secondary | ICD-10-CM | POA: Diagnosis not present

## 2024-04-20 ENCOUNTER — Other Ambulatory Visit: Payer: Self-pay

## 2024-04-20 ENCOUNTER — Emergency Department: Admission: EM | Admit: 2024-04-20 | Discharge: 2024-04-20 | Disposition: A | Discharge status: Home / Self Care

## 2024-04-20 ENCOUNTER — Emergency Department

## 2024-04-20 DIAGNOSIS — M25552 Pain in left hip: Secondary | ICD-10-CM | POA: Insufficient documentation

## 2024-04-20 LAB — POC URINE PREG, ED: Preg Test, Ur: NEGATIVE

## 2024-04-20 MED ORDER — IBUPROFEN 600 MG PO TABS
600.0000 mg | ORAL_TABLET | Freq: Once | ORAL | Status: AC
  Administered 2024-04-20: 600 mg via ORAL
  Filled 2024-04-20: qty 1

## 2024-04-20 NOTE — ED Triage Notes (Signed)
 Pt reports pain in her L inner pelvis. Pt reports pain in ROM and bearing weight. Denies fall/trauma, denies numbness/tingling to LLE.

## 2024-04-20 NOTE — Discharge Instructions (Signed)
 You were seen in the emergency department for left hip/groin pain.  Exam and workup was reassuring.  Use over-the-counter Tylenol  and ibuprofen  as directed.  Use warm heat packs.  Use massage to the area.  Follow-up with primary care physician.  Consider orthopedics and physical therapy if you can have continued symptoms after 1 week.  Return if any acutely worsening symptoms. -- RETURN PRECAUTIONS & AFTERCARE: (ENGLISH) RETURN PRECAUTIONS: Return immediately to the emergency department or see/call your doctor if you feel worse, weak or have changes in speech or vision, are short of breath, have fever, vomiting, pain, bleeding or dark stool, trouble urinating or any new issues. Return here or see/call your doctor if not improving as expected for your suspected condition. FOLLOW-UP CARE: Call your doctor and/or any doctors we referred you to for more advice and to make an appointment. Do this today, tomorrow or after the weekend. Some doctors only take PPO insurance so if you have HMO insurance you may want to contact your HMO or your regular doctor for referral to a specialist within your plan. Either way tell the doctor's office that it was a referral from the emergency department so you get the soonest possible appointment.  YOUR TEST RESULTS: Take result reports of any blood or urine tests, imaging tests and EKG's to your doctor and any referral doctor. Have any abnormal tests repeated. Your doctor or a referral doctor can let you know when this should be done. Also make sure your doctor contacts this hospital to get any test results that are not currently available such as cultures or special tests for infection and final imaging reports, which are often not available at the time you leave the ER but which may list additional important findings that are not documented on the preliminary report. BLOOD PRESSURE: If your blood pressure was greater than 120/80 have your blood pressure rechecked within 1 to 2  weeks. MEDICATION SIDE EFFECTS: Do not drive, walk, bike, take the bus, etc. if you have received or are being prescribed any sedating medications such as those for pain or anxiety or certain antihistamines like Benadryl . If you have been give one of these here get a taxi home or have a friend drive you home. Ask your pharmacist to counsel you on potential side effects of any new medication

## 2024-04-20 NOTE — ED Provider Notes (Signed)
 [facility] Provider Note    Event Date/Time   First MD Initiated Contact with Patient 04/20/24 1228     (approximate)   History   Leg Pain   HPI  Carla Hill is a 28 y.o. female who presents with 2 days of left hip and inner thigh pain.  Patient denies any trauma to the area.  Denies any skin changes or swelling.  She reports pain with range of motion of the hip and ambulating.  She works in a [employer] and is on her feet for most of the day.  She tells me that she TikTok her symptoms and she is concerned that she may have a labrum tear.  Denies any possibility of pregnancy.  This is her only issue today      Physical Exam   Triage Vital Signs: ED Triage Vitals  Encounter Vitals Group     BP 04/20/24 1216 (!) 164/105     Girls Systolic BP Percentile --      Girls Diastolic BP Percentile --      Boys Systolic BP Percentile --      Boys Diastolic BP Percentile --      Pulse Rate 04/20/24 1216 81     Resp 04/20/24 1216 18     Temp 04/20/24 1216 98.3 F (36.8 C)     Temp Source 04/20/24 1216 Oral     SpO2 04/20/24 1216 97 %     Weight 04/20/24 1217 194 lb (88 kg)     Height 04/20/24 1217 5' 6 (1.676 m)     Head Circumference --      Peak Flow --      Pain Score 04/20/24 1217 8     Pain Loc --      Pain Education --      Exclude from Growth Chart --     Most recent vital signs: Vitals:   04/20/24 1216  BP: (!) 164/105  Pulse: 81  Resp: 18  Temp: 98.3 F (36.8 C)  SpO2: 97%    Nursing Triage Note reviewed. Vital signs reviewed and patients oxygen saturation is normoxic  General: Patient is well nourished, well developed, awake and alert, resting comfortably in no acute distress Head: Normocephalic and atraumatic Eyes: Normal inspection, extraocular muscles intact, no conjunctival pallor Ear, nose, throat: Normal external exam Neck: Normal range of motion Respiratory: Patient is in no respiratory distress, lungs  CTAB Cardiovascular: Patient is not tachycardic, RRR without murmur appreciated GI: Abd SNT with no guarding or rebound  Back: Normal inspection of the back with good strength and range of motion throughout all ext Extremities: pulses intact with good cap refills, no LE pitting edema or calf tenderness Patient is able to straight leg left of the hip, tenderness is not replicated by palpation of the hip or inner thigh, no overlying skin changes, full sensation. 2+ radial and PT pulses bilaterally Neuro: The patient is alert and oriented to person, place, and time, appropriately conversive, with 5/5 bilat UE/LE strength, no gross motor or sensory defects noted. Coordination appears to be adequate. Skin: Warm, dry, and intact Psych: normal mood and affect, no SI or HI  ED Results / Procedures / Treatments   Labs (all labs ordered are listed, but only abnormal results are displayed) Labs Reviewed  POC URINE PREG, ED     EKG None  RADIOLOGY Xray left hip: No evidence of fracture or other acute abnormality on my independent review interpretation  radiologist agrees    PROCEDURES:  Critical Care performed: No  Procedures   MEDICATIONS ORDERED IN ED: Medications  ibuprofen  (ADVIL ) tablet 600 mg (600 mg Oral Given 04/20/24 1308)     IMPRESSION / MDM / ASSESSMENT AND PLAN / ED COURSE                                Differential diagnosis includes, but is not limited to, muscle strain, muscle spasm, arthritis, less likely fracture or DVT  ED course: Patient is very well-appearing and left lower extremity is neurovascularly intact.  Presentation does not seem to be consistent with DVT and she denies exogenous hormones or any other risk factors for such.  Urine pregnancy was not elevated today.  Hip x-ray was unremarkable.  Her pain improved with a dose of ibuprofen .  She feels comfortable returning home at this point.  I have provided a work note.  If her symptoms last more than a  week she was advised to follow-up with either her primary care physician or an orthopedist and consider physical therapy or return to the emergency department   Clinical Course as of 04/20/24 1715  Fri Apr 20, 2024  1253 I do not see anything acutely abnormal on the patient's x-ray.  Awaiting radiology read but patient okay with discharge home [HD]  1331 DG Hip Unilat With Pelvis 2-3 Views Left Unremarkable [HD]    Clinical Course User Index [HD] [name], MD   At time of discharge there is no evidence of acute life, limb, vision, or fertility threat. Patient has stable vital signs, pain is well controlled, patient is ambulatory and p.o. tolerant.  Discharge instructions were completed using the EPIC system. I would refer you to those at this time. All warnings prescriptions follow-up etc. were discussed in detail with the patient. Patient indicates understanding and is agreeable with this plan. All questions answered.  Patient is made aware that they may return to the emergency department for any worsening or new condition or for any other emergency.  --  COPA: 5 The patient has the following acute or chronic illness/injury that poses a possible threat to life or bodily function: [X] : The patient has a potentially serious acute condition or an acute exacerbation of a chronic illness requiring urgent evaluation and management in the Emergency Department. The clinical presentation necessitates immediate consideration of life-threatening or function-threatening diagnoses, even if they are ultimately ruled out.   FINAL CLINICAL IMPRESSION(S) / ED DIAGNOSES   Final diagnoses:  Left hip pain     Rx / DC Orders   ED Discharge Orders     None        Note:  This document was prepared using Dragon voice recognition software and may include unintentional dictation errors.   [name], MD 04/20/24 [phone]
